# Patient Record
Sex: Male | Born: 1960 | Hispanic: Yes | Marital: Married
Health system: Southern US, Community
[De-identification: ages and names within clinical notes are randomized; demographics above are authoritative.]

## PROBLEM LIST (undated history)

## (undated) DIAGNOSIS — I10 Essential (primary) hypertension: Secondary | ICD-10-CM

## (undated) DIAGNOSIS — E7800 Pure hypercholesterolemia, unspecified: Secondary | ICD-10-CM

## (undated) HISTORY — PX: RECTOPERITONEAL FISTULA CLOSURE: SHX2314

---

## 2000-07-13 ENCOUNTER — Observation Stay (HOSPITAL_COMMUNITY): Admission: EM | Admit: 2000-07-13 | Discharge: 2000-07-13 | Payer: Self-pay | Admitting: Emergency Medicine

## 2000-07-13 ENCOUNTER — Encounter: Payer: Self-pay | Admitting: Emergency Medicine

## 2003-05-10 ENCOUNTER — Encounter: Payer: Self-pay | Admitting: Family Medicine

## 2003-05-10 ENCOUNTER — Encounter: Admission: RE | Admit: 2003-05-10 | Discharge: 2003-05-10 | Payer: Self-pay | Admitting: Family Medicine

## 2006-01-01 ENCOUNTER — Encounter: Admission: RE | Admit: 2006-01-01 | Discharge: 2006-01-01 | Payer: Self-pay | Admitting: Family Medicine

## 2008-05-08 ENCOUNTER — Emergency Department (HOSPITAL_COMMUNITY): Admission: EM | Admit: 2008-05-08 | Discharge: 2008-05-08 | Payer: Self-pay | Admitting: Emergency Medicine

## 2009-01-22 ENCOUNTER — Encounter: Admission: RE | Admit: 2009-01-22 | Discharge: 2009-01-22 | Payer: Self-pay | Admitting: Family Medicine

## 2010-07-22 IMAGING — US US RENAL
1 series · 14 of 25 positions shown · non-contrast
Comparison: None

CLINICAL DATA: Chronic renal disease.  Left flank pain for 3 weeks.

RENAL/URINARY TRACT ULTRASOUND COMPLETE

[Series 1: us renal · 0.29mm/px · 14 of 29 slices shown]
[im 1/29]
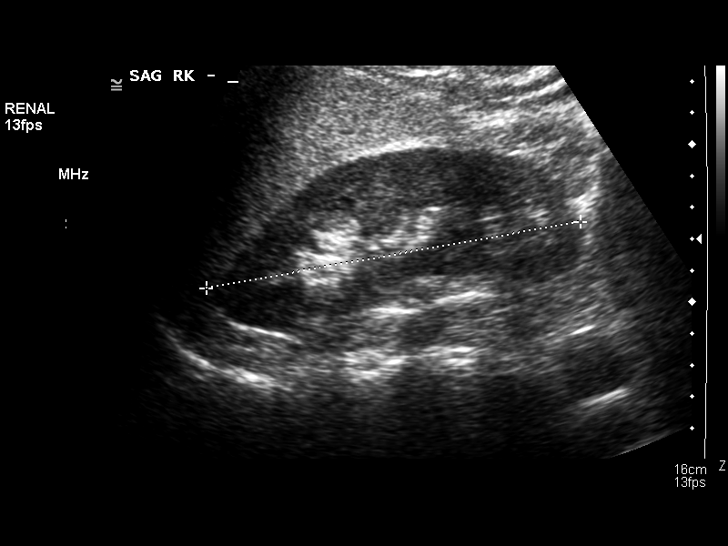
[im 3/29]
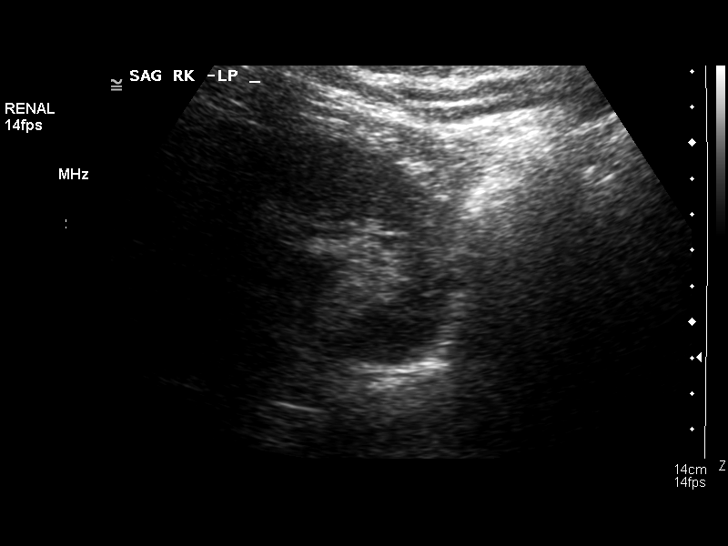
[im 5/29]
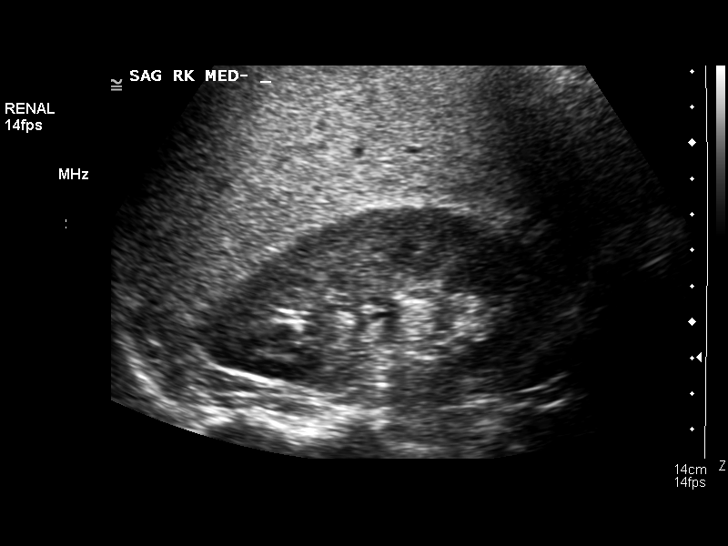
[im 8/29]
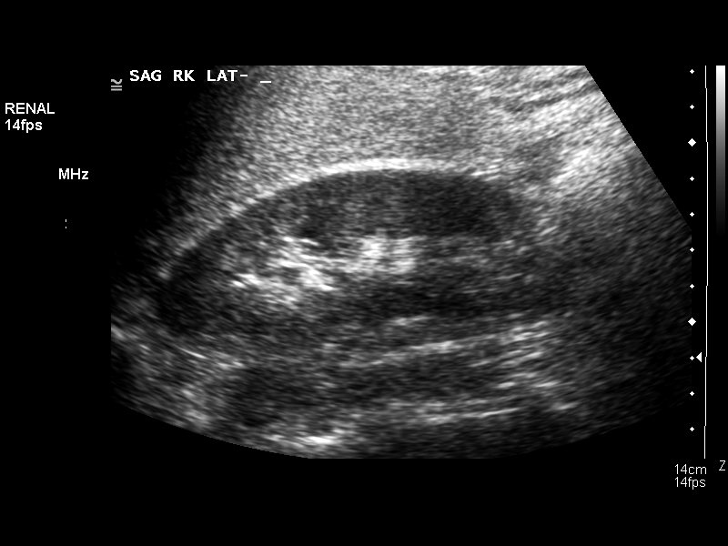
[im 10/29]
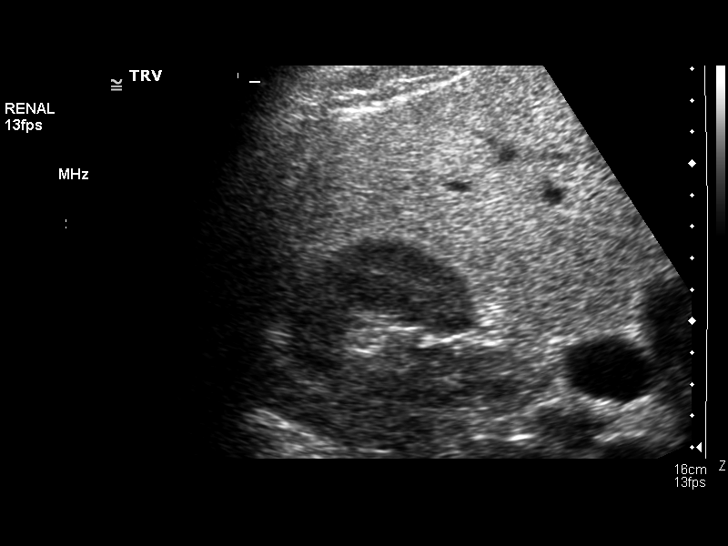
[im 11/29]
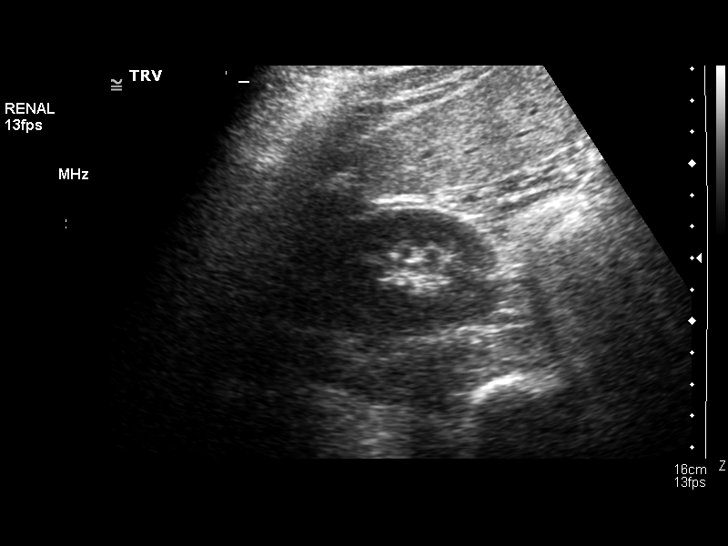
[im 13/29]
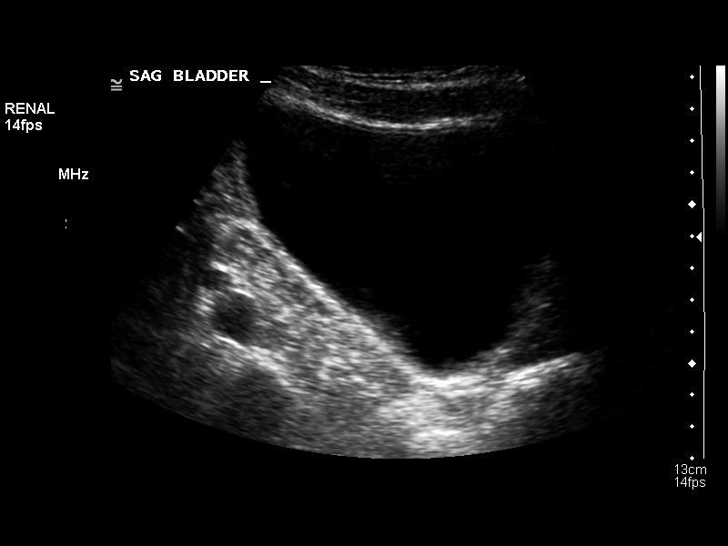
[im 16/29]
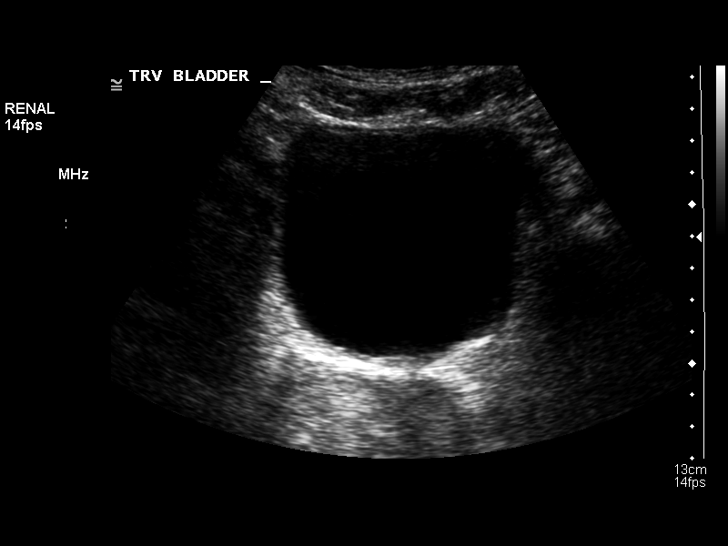
[im 18/29]
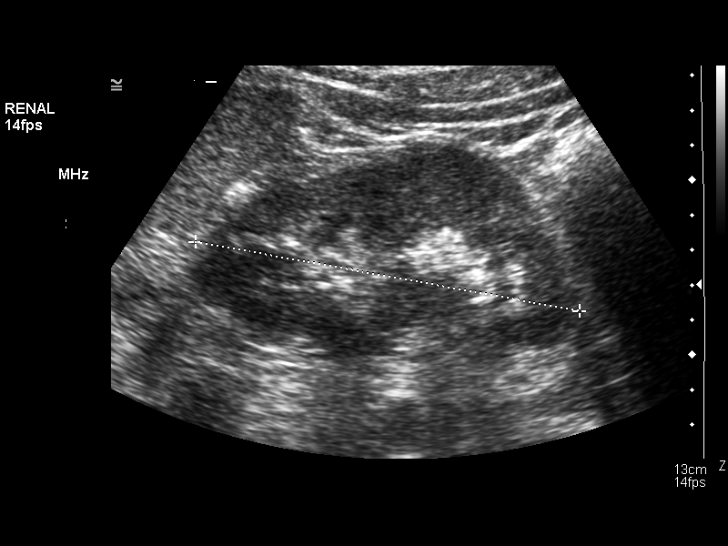
[im 19/29]
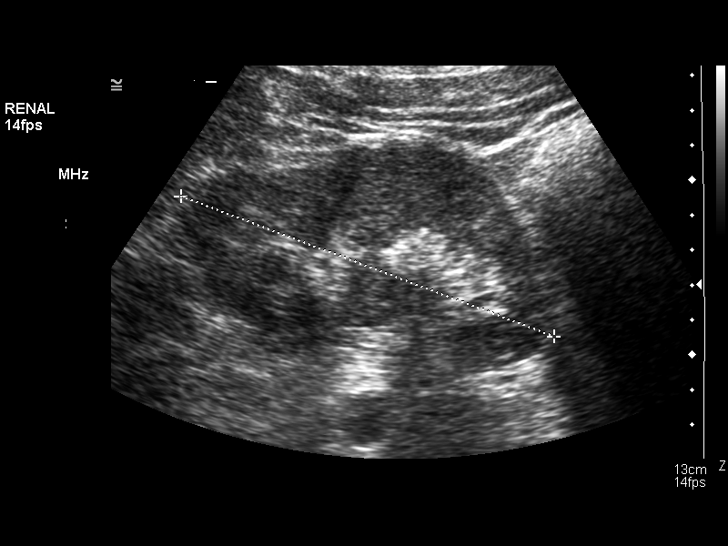
[im 22/29]
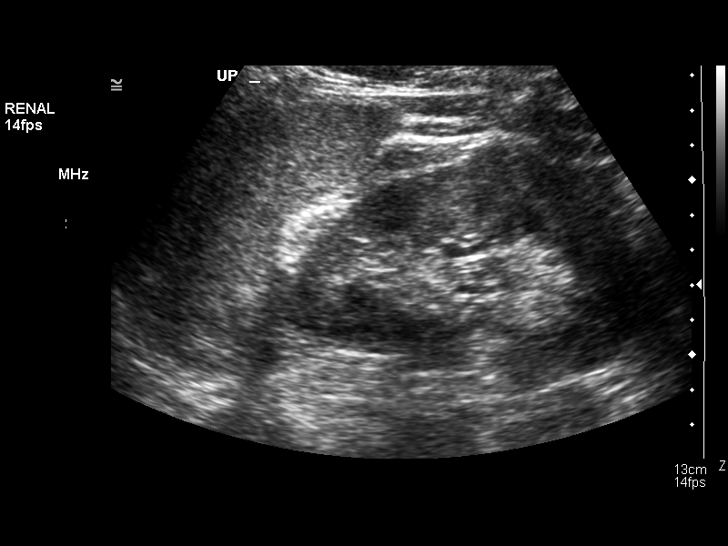
[im 24/29]
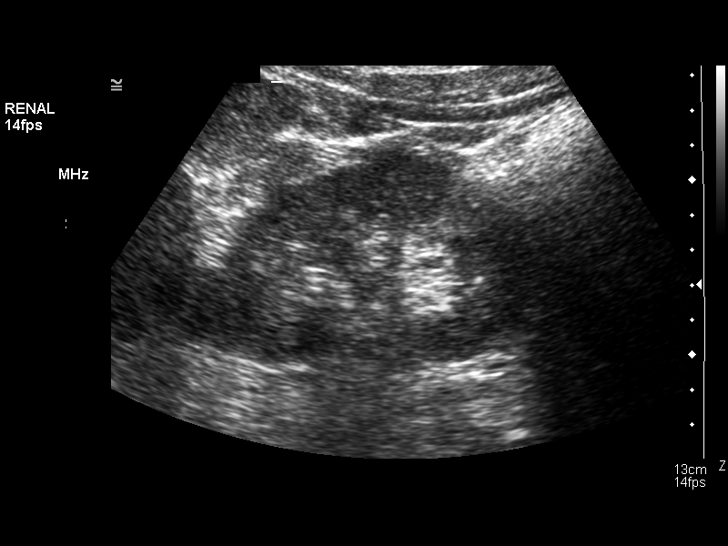
[im 26/29]
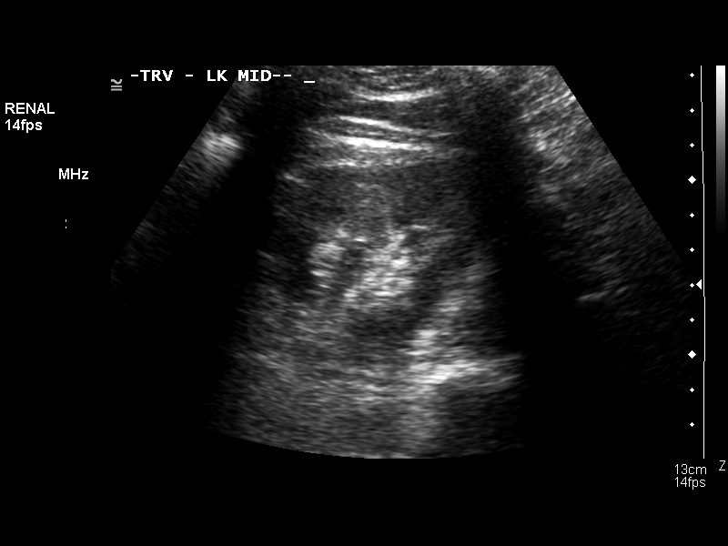
[im 29/29]
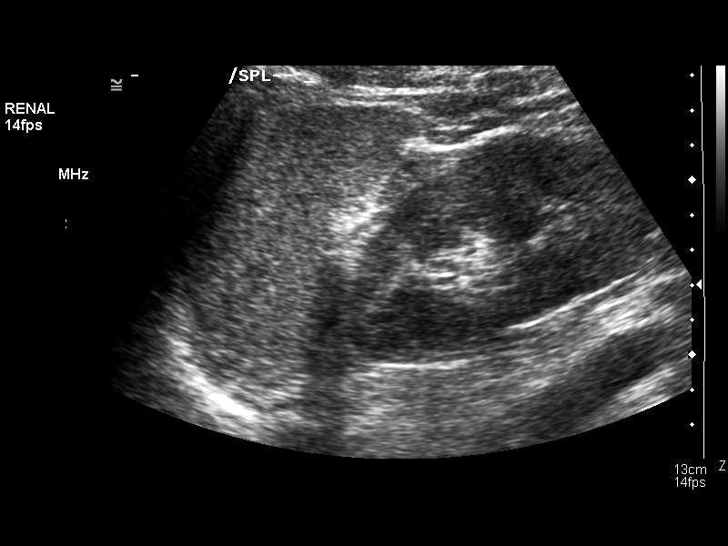

[14 of 25 positions shown; findings below may reference images not displayed]

FINDINGS: Right Kidney:  12.3 cm, negative.

Left Kidney:  11.4 cm, negative.

Bladder:  Normal.
IMPRESSION: No acute findings.

## 2010-12-13 NOTE — H&P (Signed)
[place]  Patient:    Brandon Walker, Brandon Walker                   MRN: [id number] Adm. Date:  [id number] Disc. Date: [id number] Attending:  [place], Peter Manning                         History and Physical  CHIEF COMPLAINT: Mr. Wiley is a 50 year old Hispanic male who presented to the emergency room today with a complaint of chest pain.  HISTORY OF PRESENT ILLNESS: The patient developed mid sternal chest pain last night after eating dinner.  He thought it was heartburn and took four Tums, and eventually his pain abated after three to four hours, but it did seem to improve with the Tums.  This morning he had no pain on awakening but on his way to work developed recurrent chest pain, this time radiating down both arms.  There was no nausea, vomiting, diaphoresis, or shortness of breath.  He presented to the emergency room and was given three sublingual nitroglycerin, which seemed to help but he still had persistent vague tightness in his chest. The patient reports he had an episode of chest pain 1-1/2 years ago and was seen at Urgent Care.  His evaluation at that time was negative but it was recommended that he have a follow-up stress test.  The patient did not follow up at that time.  PAST MEDICAL HISTORY:  1. Asthma.  2. Hypercholesterolemia.  3. History of nevous stomach.  PAST SURGICAL HISTORY: He has had no prior surgeries.  MEDICATIONS: He is on no medications.  ALLERGIES: No known drug allergies.  SOCIAL HISTORY: The patient is married.  He works at [employer].  He denies smoking or alcohol use.  FAMILY HISTORY: Unremarkable.  One brother has diabetes.  He does not know his fathers history.  His mother is alive and well.  PHYSICAL EXAMINATION:  GENERAL: The patient is a young Hispanic male in no apparent distress.  VITAL SIGNS: Blood pressure 120/65, pulse 70 and regular.  He is afebrile.  HEENT: PERRL.  Conjunctivae clear.   Oropharynx clear.  NECK: No JVD, adenopathy, or bruits.  LUNGS: Clear to auscultation and percussion.  CARDIAC: Regular rate and rhythm without murmurs, rubs, gallops, or clicks.  ABDOMEN: Soft, nontender.  Bowel sounds positive.  No hepatosplenomegaly, masses, or bruits.  EXTREMITIES: Without edema.  Femoral and pedal pulses were 2+ and symmetric.  NEUROLOGIC: Grossly intact.  LABORATORY DATA: CK is 122 with negative MB.  Creatinine 1.1.  Sodium 140, potassium 4.0, chloride 105, BUN 16, glucose 153.  ABG showed a pH of 7.46, pCO2 33, bicarbonate 23.  Hemoglobin 13.0, hematocrit 35.7, WBC 7200; platelets 233,000.  Troponin was 0.01.  ECG x 2 demonstrated normal sinus rhythm with mild diffuse ST elevation and a pattern of early repolarization.  IMPRESSION:  1. Atypical chest pain, rule out myocardial infarction.  2. History of asthma.  3. Hypercholesterolemia.  PLAN: The patient will be treated with p.o. Protonix and we will check a second set of cardiac enzymes in six hours.  If these are negative we will discharge the patient home on oral Protonix and arrange a follow-up exercise stress test in our office as an outpatient. DD:  07/13/00 TD:  07/14/00 Job: [id number]/[id number]

## 2011-04-28 LAB — DIFFERENTIAL
Basophils Relative: 0
Eosinophils Absolute: 0.1
Lymphs Abs: 1.4
Monocytes Relative: 7
Neutro Abs: 4.7
Neutrophils Relative %: 71

## 2011-04-28 LAB — BASIC METABOLIC PANEL
BUN: 11
CO2: 27
Calcium: 9.7
Chloride: 99
Creatinine, Ser: 0.74
GFR calc Af Amer: >60
GFR calc non Af Amer: >60
Glucose, Bld: 221 — ABNORMAL HIGH
Potassium: 4.5
Sodium: 134 — ABNORMAL LOW

## 2011-04-28 LAB — CBC
MCHC: 34.2
MCV: 86.6
Platelets: 208
RBC: 5.38
WBC: 6.7

## 2011-07-31 ENCOUNTER — Emergency Department (HOSPITAL_COMMUNITY): Payer: BC Managed Care – PPO

## 2011-07-31 ENCOUNTER — Emergency Department (HOSPITAL_COMMUNITY)
Admission: EM | Admit: 2011-07-31 | Discharge: 2011-07-31 | Disposition: A | Discharge status: Home / Self Care | Payer: BC Managed Care – PPO | Attending: Emergency Medicine | Admitting: Emergency Medicine

## 2011-07-31 ENCOUNTER — Other Ambulatory Visit: Payer: Self-pay

## 2011-07-31 ENCOUNTER — Encounter: Payer: Self-pay | Admitting: *Deleted

## 2011-07-31 DIAGNOSIS — R209 Unspecified disturbances of skin sensation: Secondary | ICD-10-CM | POA: Insufficient documentation

## 2011-07-31 DIAGNOSIS — G5600 Carpal tunnel syndrome, unspecified upper limb: Secondary | ICD-10-CM | POA: Insufficient documentation

## 2011-07-31 DIAGNOSIS — Z794 Long term (current) use of insulin: Secondary | ICD-10-CM | POA: Insufficient documentation

## 2011-07-31 DIAGNOSIS — E119 Type 2 diabetes mellitus without complications: Secondary | ICD-10-CM | POA: Insufficient documentation

## 2011-07-31 DIAGNOSIS — I10 Essential (primary) hypertension: Secondary | ICD-10-CM | POA: Insufficient documentation

## 2011-07-31 DIAGNOSIS — R51 Headache: Secondary | ICD-10-CM | POA: Insufficient documentation

## 2011-07-31 DIAGNOSIS — R002 Palpitations: Secondary | ICD-10-CM | POA: Insufficient documentation

## 2011-07-31 HISTORY — DX: Essential (primary) hypertension: I10

## 2011-07-31 HISTORY — DX: Pure hypercholesterolemia, unspecified: E78.00

## 2011-07-31 LAB — GLUCOSE, CAPILLARY: Glucose-Capillary: 257 mg/dL — ABNORMAL HIGH (ref 70–99)

## 2011-07-31 LAB — BASIC METABOLIC PANEL
BUN: 16 mg/dL (ref 6–23)
Creatinine, Ser: 0.81 mg/dL (ref 0.50–1.35)
GFR calc Af Amer: >90 mL/min (ref >90)
GFR calc non Af Amer: >90 mL/min (ref >90)

## 2011-07-31 LAB — CBC
HCT: 42.8 % (ref 39.0–52.0)
Hemoglobin: 15.2 g/dL (ref 13.0–17.0)
MCH: 29.6 pg (ref 26.0–34.0)
MCHC: 35.5 g/dL (ref 30.0–36.0)
MCV: 83.4 fL (ref 78.0–100.0)

## 2011-07-31 LAB — DIFFERENTIAL
Basophils Relative: 0 % (ref 0–1)
Eosinophils Absolute: 0.1 10*3/uL (ref 0.0–0.7)
Eosinophils Relative: 2 % (ref 0–5)
Monocytes Absolute: 0.5 10*3/uL (ref 0.1–1.0)
Monocytes Relative: 8 % (ref 3–12)
Neutrophils Relative %: 57 % (ref 43–77)

## 2011-07-31 MED ORDER — SODIUM CHLORIDE 0.9 % IV BOLUS (SEPSIS)
1000.0000 mL | Freq: Once | INTRAVENOUS | Status: AC
  Administered 2011-07-31: 1000 mL via INTRAVENOUS

## 2011-07-31 MED ORDER — TRAMADOL HCL 50 MG PO TABS: 50.0000 mg | ORAL_TABLET | Freq: Four times a day (QID) | ORAL | Status: AC | PRN

## 2011-07-31 MED ORDER — MORPHINE SULFATE 4 MG/ML IJ SOLN
4.0000 mg | Freq: Once | INTRAMUSCULAR | Status: AC
  Administered 2011-07-31: 4 mg via INTRAVENOUS
  Filled 2011-07-31: qty 1

## 2011-07-31 NOTE — ED Provider Notes (Signed)
History     CSN: [id number]  Arrival date & time 07/31/11  1358   First MD Initiated Contact with Patient 07/31/11 1630      Chief Complaint  Patient presents with  . Numbness    right arm and hand  HPI Patient presents to the emergency room with complaint of right hand numbness that occurred today after lunch. The patient works at [employer] and lifts a lot of lumbar. Patient reports some mild heart palpitations, but no chest pain or SOB. Patient denies any headaches, vertigo, weakness or other complaints. Denies any chest pain with exertion. Denies any exertional dyspnea. Denies any other complaints.   Past Medical History  Diagnosis Date  . Diabetes mellitus   . Hypertension   . Hypercholesteremia     Past Surgical History  Procedure Date  . Rectoperitoneal fistula closure     No family history on file.  History  Substance Use Topics  . Smoking status: Passive Smoker    Types: Cigars  . Smokeless tobacco: Not on file  . Alcohol Use: No      Review of Systems  Constitutional: Negative for fever, chills, diaphoresis and appetite change.  HENT: Negative for neck pain.   Eyes: Negative for photophobia and visual disturbance.  Respiratory: Negative for cough, choking, chest tightness, shortness of breath, wheezing and stridor.   Cardiovascular: Positive for palpitations. Negative for chest pain and leg swelling.  Gastrointestinal: Negative for nausea, vomiting and abdominal pain.  Genitourinary: Negative for flank pain.  Musculoskeletal: Negative for back pain.  Skin: Negative for rash.  Neurological: Positive for numbness. Negative for weakness.  All other systems reviewed and are negative.    Allergies  Review of patient's allergies indicates no known allergies.  Home Medications   Current Outpatient Rx  Name Route Sig Dispense Refill  . ASPIRIN 81 MG PO TABS Oral Take 160 mg by mouth daily.      Marland Kitchen EZETIMIBE 10 MG PO TABS Oral Take 10 mg by mouth daily.       . IBUPROFEN 200 MG PO TABS Oral Take 400 mg by mouth every 6 (six) hours as needed.      . INSULIN ASPART PROT & ASPART (70-30) 100 UNIT/ML [place] SUSP Subcutaneous Inject into the skin. 35 Units QAM & 25 QHS     . LISINOPRIL 20 MG PO TABS Oral Take 20 mg by mouth daily.      Marland Kitchen PRAVASTATIN SODIUM 10 MG PO TABS Oral Take 10 mg by mouth daily.        BP 114/69  Pulse 67  Temp(Src) 97.7 F (36.5 C) (Oral)  Resp 18  Wt 185 lb (83.915 kg)  SpO2 98%  Physical Exam  Nursing note and vitals reviewed. Constitutional: He is oriented to person, place, and time. He appears well-developed and well-nourished. No distress.  HENT:  Head: Normocephalic and atraumatic.  Mouth/Throat: Oropharynx is clear and moist.  Eyes: EOM are normal. Pupils are equal, round, and reactive to light.  Neck: Normal range of motion. Neck supple.  Cardiovascular: Normal rate, regular rhythm, normal heart sounds and intact distal pulses.  Exam reveals no gallop and no friction rub.   No murmur heard. Pulmonary/Chest: Effort normal and breath sounds normal. No respiratory distress. He has no wheezes. He exhibits no tenderness.  Abdominal: Soft. Bowel sounds are normal. There is no tenderness. There is no rebound and no guarding.  Musculoskeletal: Normal range of motion. He exhibits no edema and no tenderness.  Positive tinel's sign to both wrist. Radial pulse intact. Full sensation. Strength 5/5 for upper and lower extremities.   Neurological: He is alert and oriented to person, place, and time. He displays normal reflexes. No cranial nerve deficit or sensory deficit. He exhibits normal muscle tone. He displays a negative Romberg sign. Coordination and gait normal. GCS eye subscore is 4. GCS verbal subscore is 5. GCS motor subscore is 6. He displays no Babinski's sign on the right side. He displays no Babinski's sign on the left side.       Normal finger to nose testing. Normal grip. No pronator drift. No nystagmus on  examination. Normal fast alternating movements.   Skin: Skin is warm and dry. No rash noted. He is not diaphoretic. No erythema. No pallor.  Psychiatric: He has a normal mood and affect. His behavior is normal. Judgment and thought content normal.    ED Course  Procedures (including critical care time)  Patient seen and evaluated.  VSS reviewed. . Nursing notes reviewed. Discussed with attending physician, Dr. Effie Shy. Initial testing ordered. Will monitor the patient closely. They agree with the treatment plan and diagnosis.   Results for orders placed during the hospital encounter of 07/31/11  GLUCOSE, CAPILLARY      Component Value Range   Glucose-Capillary 257 (*) 70 - 99 (mg/dL)  CBC      Component Value Range   WBC 7.1  4.0 - 10.5 (K/uL)   RBC 5.13  4.22 - 5.81 (MIL/uL)   Hemoglobin 15.2  13.0 - 17.0 (g/dL)   HCT 16.1  09.6 - 04.5 (%)   MCV 83.4  78.0 - 100.0 (fL)   MCH 29.6  26.0 - 34.0 (pg)   MCHC 35.5  30.0 - 36.0 (g/dL)   RDW 40.9  81.1 - 91.4 (%)   Platelets 206  150 - 400 (K/uL)  DIFFERENTIAL      Component Value Range   Neutrophils Relative 57  43 - 77 (%)   Neutro Abs 4.0  1.7 - 7.7 (K/uL)   Lymphocytes Relative 34  12 - 46 (%)   Lymphs Abs 2.4  0.7 - 4.0 (K/uL)   Monocytes Relative 8  3 - 12 (%)   Monocytes Absolute 0.5  0.1 - 1.0 (K/uL)   Eosinophils Relative 2  0 - 5 (%)   Eosinophils Absolute 0.1  0.0 - 0.7 (K/uL)   Basophils Relative 0  0 - 1 (%)   Basophils Absolute 0.0  0.0 - 0.1 (K/uL)  BASIC METABOLIC PANEL      Component Value Range   Sodium 136  135 - 145 (mEq/L)   Potassium 4.2  3.5 - 5.1 (mEq/L)   Chloride 100  96 - 112 (mEq/L)   CO2 26  19 - 32 (mEq/L)   Glucose, Bld 240 (*) 70 - 99 (mg/dL)   BUN 16  6 - 23 (mg/dL)   Creatinine, Ser 7.82  0.50 - 1.35 (mg/dL)   Calcium 9.8  8.4 - 95.6 (mg/dL)   GFR calc non Af Amer >90  >90 (mL/min)   GFR calc Af Amer >90  >90 (mL/min)  TROPONIN I      Component Value Range   Troponin I <0.30  <0.30 (ng/mL)     Dg Chest 2 View  07/31/2011  *RADIOLOGY REPORT*  Clinical Data: Headache, hypertension, diabetes  CHEST - 2 VIEW  Comparison: Chest x-ray of 05/08/2008  Findings: The lungs are clear.  Mediastinal contours appear normal. The heart is  within normal limits in size.  No bony abnormality is seen.  IMPRESSION: No active lung disease.  Original Report Authenticated By: Juline Patch, M.D.    Patient seen and re-evaluated. Resting comfortably. VSS stable. NAD. Patient notified of testing results. Stated agreement and understanding. Patient stated understanding to treatment plan and diagnosis. Findings consistent with carpal tunnel syndrome, no weakness/numbness. No neurofocal deficits. Patient states that he feels better. States that he will follow up with his primary car ephysician for further evaluation, Dr. Laurine Blazer.    Date: 07/31/2011  Rate: 76  Rhythm: normal sinus rhythm  QRS Axis: normal  Intervals: normal  ST/T Wave abnormalities: normal  Conduction Disutrbances:none  Narrative Interpretation:   Old EKG Reviewed: unchanged   MDM  Numbness, carpal tunnel syndrome. Only located at the hands. Positive tinel's. Patient has no neurofocal deficits. No chest pain. Workup negative. Will refer to orthopedics.         Demetrius Charity, PA 07/31/11 1920

## 2011-07-31 NOTE — ED Notes (Signed)
Pt states "about 2 days ago I felt fluttering in my chest, like my heart, today about 1300 I developed right arm and right leg got numb, when I was walking my hands and right leg got really clammy"

## 2011-07-31 NOTE — ED Provider Notes (Signed)
2:42 PM  Date: 07/31/2011  Rate: 76  Rhythm: normal sinus rhythm  QRS Axis: normal  Intervals: normal  ST/T Wave abnormalities: normal  Conduction Disutrbances:none  Narrative Interpretation: Normal EKG  Old EKG Reviewed: unchanged    Carleene Cooper III, MD 07/31/11 1443

## 2011-07-31 NOTE — ED Notes (Signed)
Rx given x1 D/c instructions reviewed w/ pt and family - pt and family deny any further questions or concerns at present.  

## 2011-08-01 NOTE — ED Provider Notes (Signed)
Medical screening examination/treatment/procedure(s) were performed by non-physician practitioner and as supervising physician I was immediately available for consultation/collaboration.  Flint Melter, MD 08/01/11 Jacinta Shoe

## 2013-01-27 IMAGING — CR DG CHEST 2V
2 series · 2 of 2 positions shown · non-contrast
Comparison: Chest x-ray of 05/08/2008

CLINICAL DATA: Headache, hypertension, diabetes

CHEST - 2 VIEW

[w chest pa]
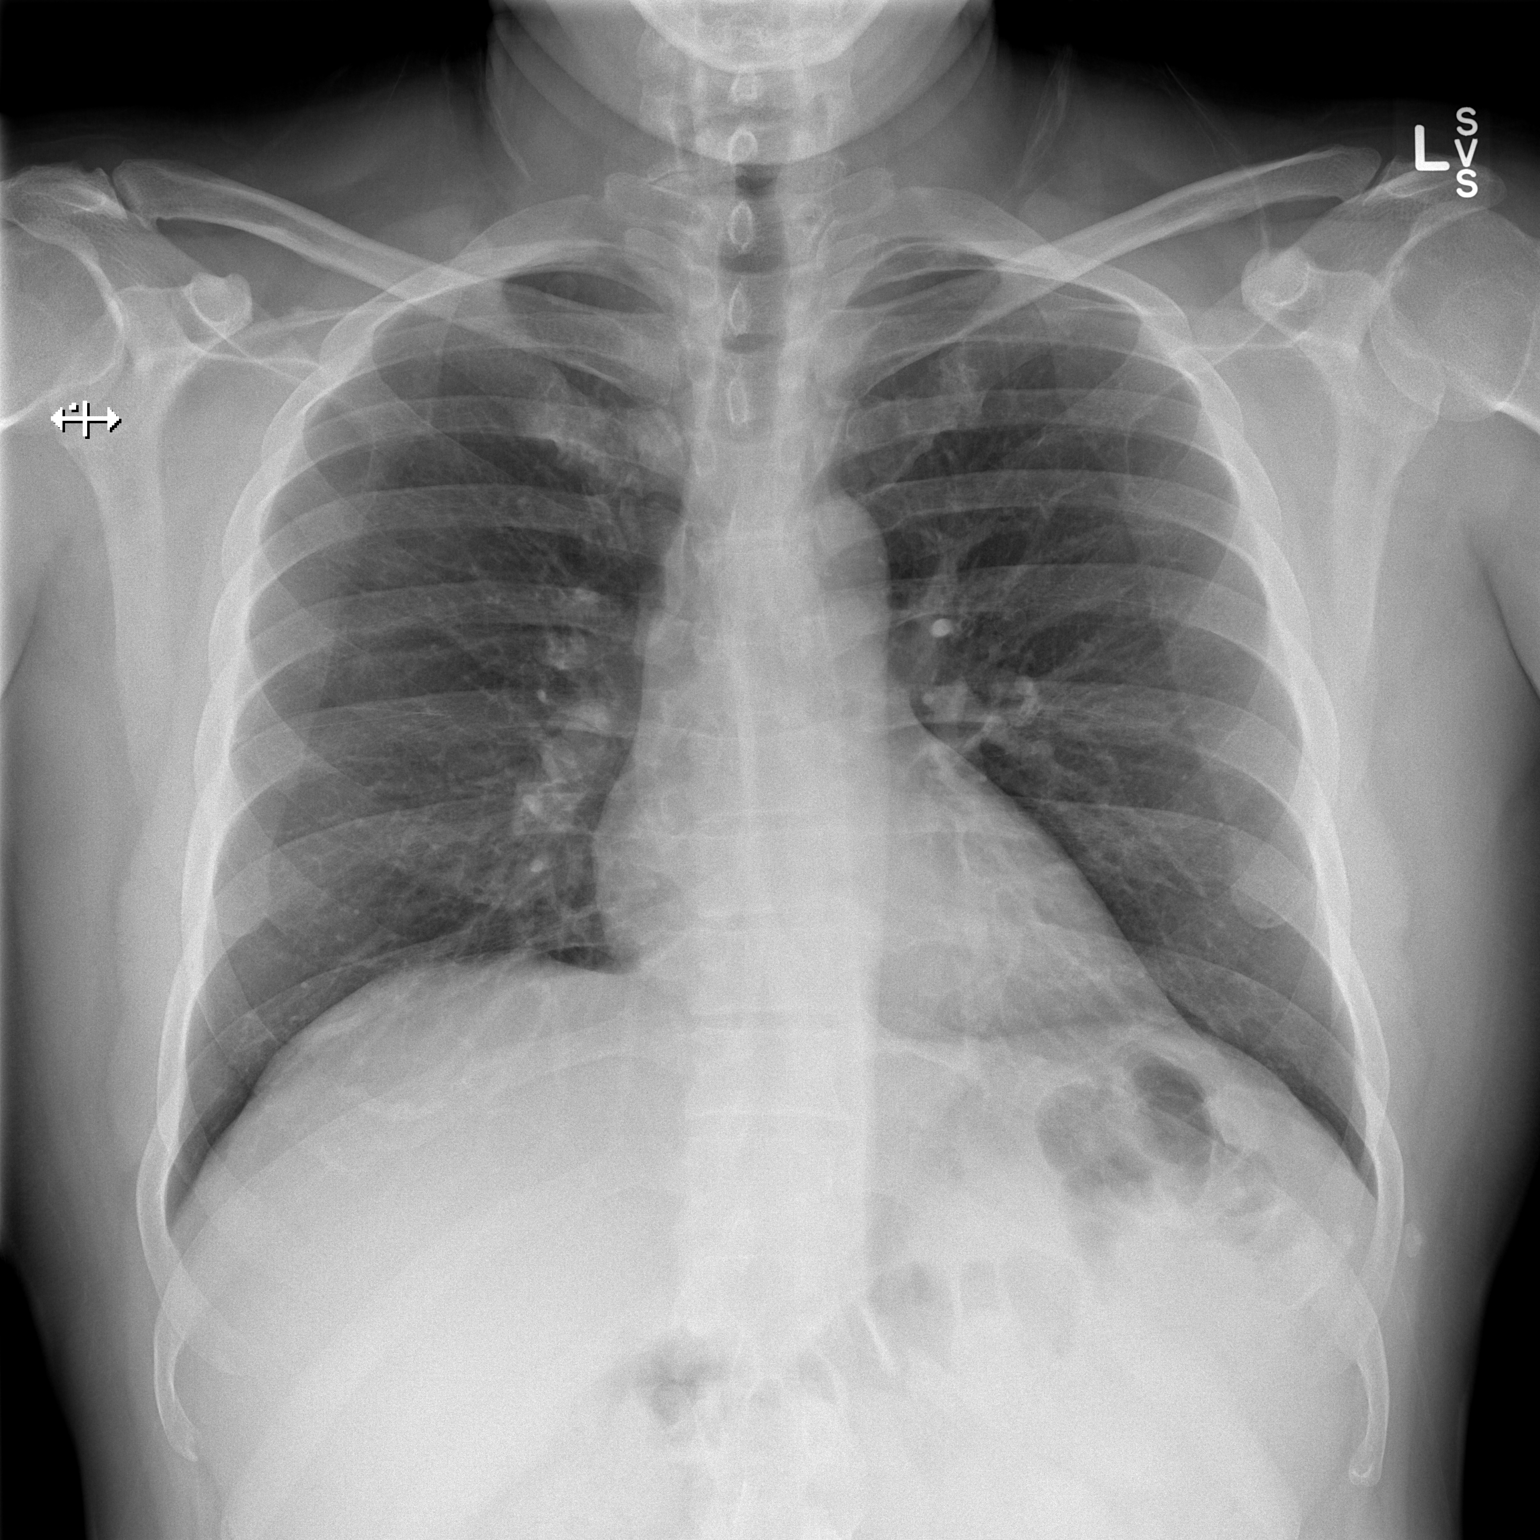

[w chest lat]
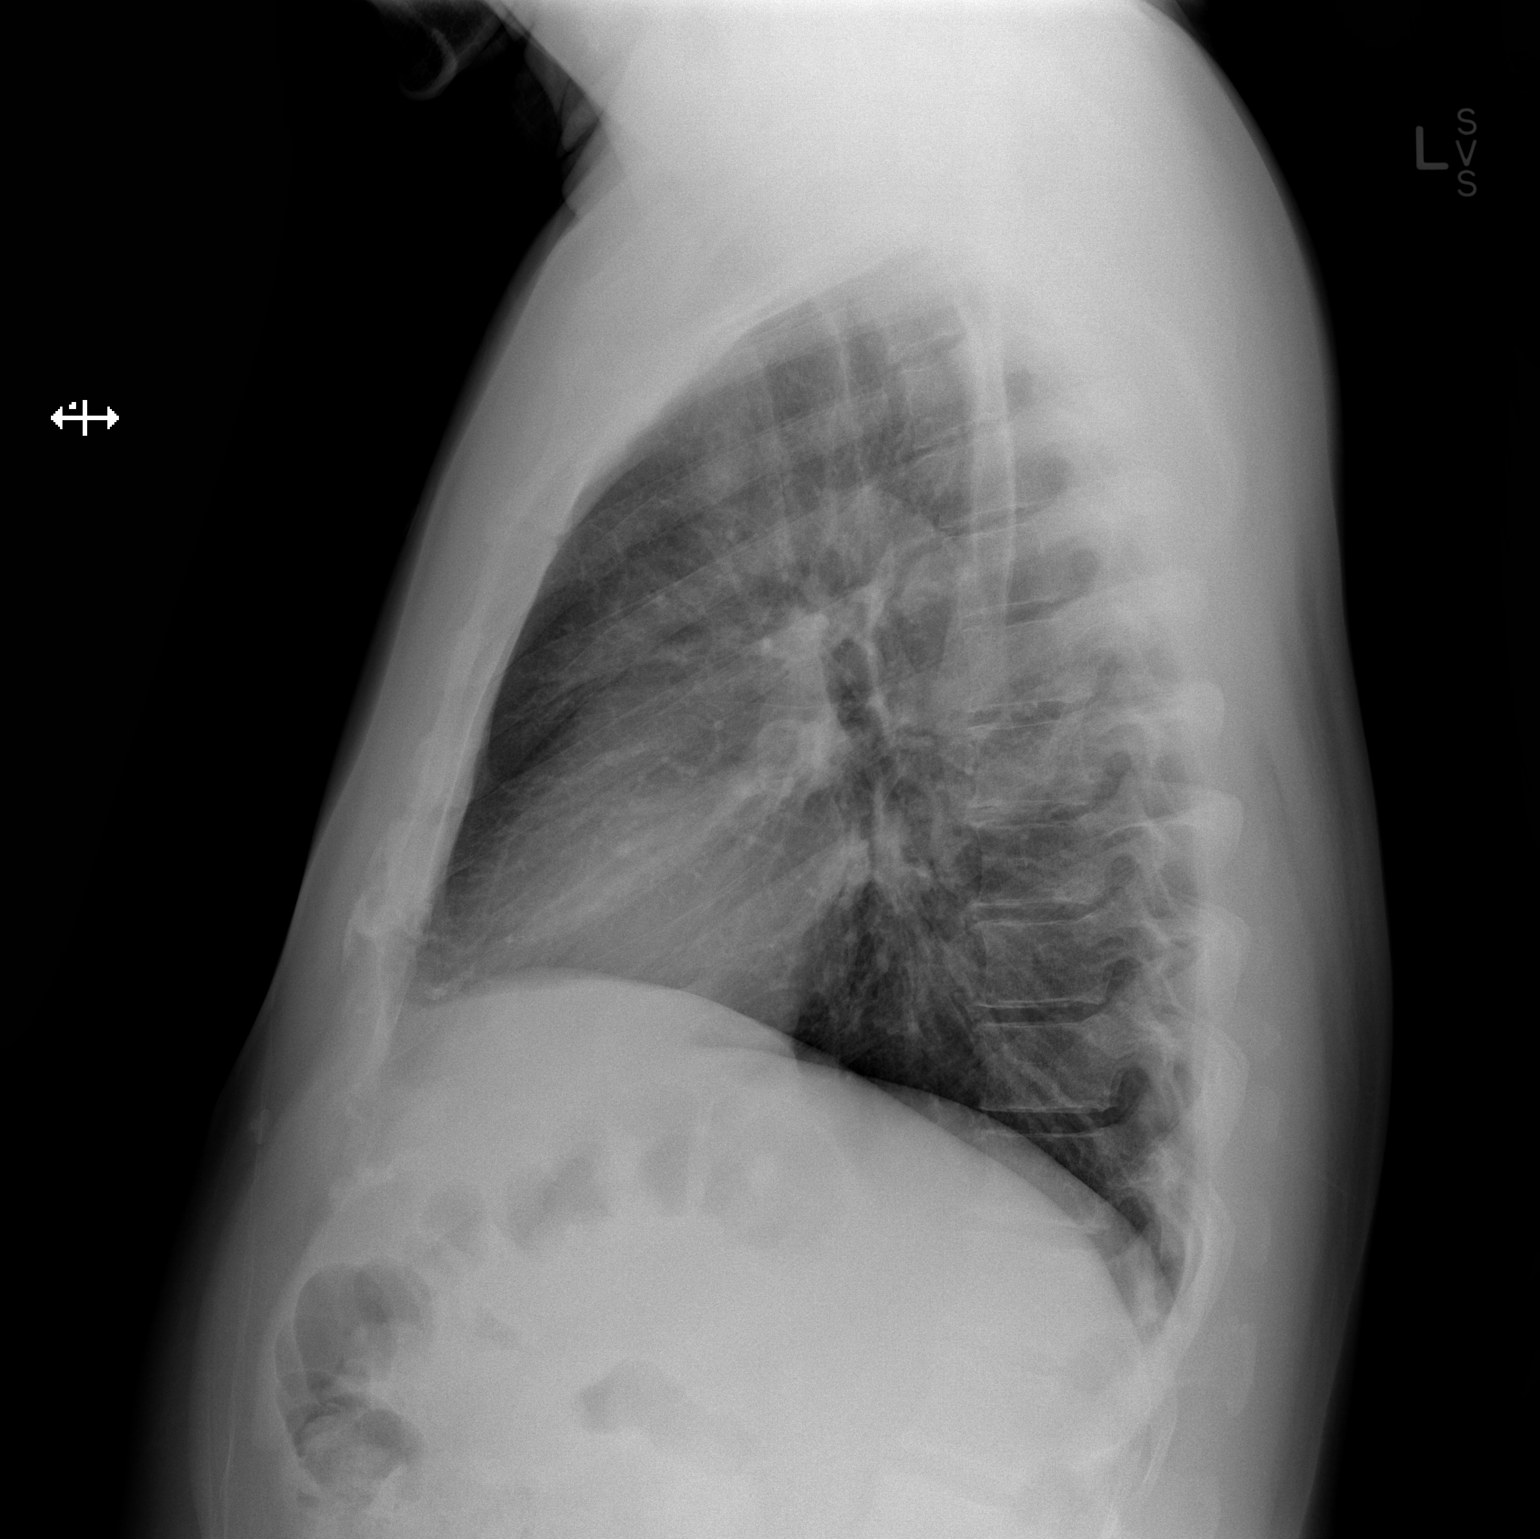

[2 of 2 positions shown; findings below may reference images not displayed]

FINDINGS: The lungs are clear.  Mediastinal contours appear normal.
The heart is within normal limits in size.  No bony abnormality is
seen.
IMPRESSION: No active lung disease.

## 2014-02-24 ENCOUNTER — Observation Stay (HOSPITAL_COMMUNITY)
Admission: EM | Admit: 2014-02-24 | Discharge: 2014-02-25 | Disposition: A | Discharge status: Home / Self Care | Payer: Managed Care, Other (non HMO) | Attending: Internal Medicine | Admitting: Internal Medicine

## 2014-02-24 ENCOUNTER — Emergency Department (HOSPITAL_COMMUNITY): Payer: Managed Care, Other (non HMO)

## 2014-02-24 ENCOUNTER — Encounter (HOSPITAL_COMMUNITY): Payer: Self-pay | Admitting: Emergency Medicine

## 2014-02-24 DIAGNOSIS — K2100 Gastro-esophageal reflux disease with esophagitis, without bleeding: Secondary | ICD-10-CM

## 2014-02-24 DIAGNOSIS — E7800 Pure hypercholesterolemia, unspecified: Secondary | ICD-10-CM | POA: Insufficient documentation

## 2014-02-24 DIAGNOSIS — Z23 Encounter for immunization: Secondary | ICD-10-CM | POA: Insufficient documentation

## 2014-02-24 DIAGNOSIS — E099 Drug or chemical induced diabetes mellitus without complications: Secondary | ICD-10-CM

## 2014-02-24 DIAGNOSIS — Z794 Long term (current) use of insulin: Secondary | ICD-10-CM | POA: Insufficient documentation

## 2014-02-24 DIAGNOSIS — I10 Essential (primary) hypertension: Secondary | ICD-10-CM

## 2014-02-24 DIAGNOSIS — Z79899 Other long term (current) drug therapy: Secondary | ICD-10-CM | POA: Insufficient documentation

## 2014-02-24 DIAGNOSIS — Z7982 Long term (current) use of aspirin: Secondary | ICD-10-CM | POA: Insufficient documentation

## 2014-02-24 DIAGNOSIS — E119 Type 2 diabetes mellitus without complications: Secondary | ICD-10-CM

## 2014-02-24 DIAGNOSIS — K219 Gastro-esophageal reflux disease without esophagitis: Secondary | ICD-10-CM | POA: Diagnosis present

## 2014-02-24 DIAGNOSIS — E785 Hyperlipidemia, unspecified: Secondary | ICD-10-CM | POA: Diagnosis present

## 2014-02-24 DIAGNOSIS — R079 Chest pain, unspecified: Principal | ICD-10-CM

## 2014-02-24 LAB — BASIC METABOLIC PANEL
Anion gap: 14 (ref 5–15)
BUN: 18 mg/dL (ref 6–23)
CHLORIDE: 102 meq/L (ref 96–112)
CO2: 23 meq/L (ref 19–32)
Calcium: 9.8 mg/dL (ref 8.4–10.5)
Creatinine, Ser: 0.87 mg/dL (ref 0.50–1.35)
GFR calc non Af Amer: >90 mL/min (ref >90)
Glucose, Bld: 137 mg/dL — ABNORMAL HIGH (ref 70–99)
POTASSIUM: 4.3 meq/L (ref 3.7–5.3)
Sodium: 139 mEq/L (ref 137–147)

## 2014-02-24 LAB — I-STAT TROPONIN, ED
Troponin i, poc: 0.01 ng/mL (ref 0.00–0.08)
Troponin i, poc: 0 ng/mL (ref 0.00–0.08)

## 2014-02-24 LAB — CBC
HEMATOCRIT: 39.7 % (ref 39.0–52.0)
HEMOGLOBIN: 14 g/dL (ref 13.0–17.0)
MCH: 29.7 pg (ref 26.0–34.0)
MCHC: 35.3 g/dL (ref 30.0–36.0)
MCV: 84.1 fL (ref 78.0–100.0)
Platelets: 257 10*3/uL (ref 150–400)
RBC: 4.72 MIL/uL (ref 4.22–5.81)
RDW: 12.1 % (ref 11.5–15.5)
WBC: 7.2 10*3/uL (ref 4.0–10.5)

## 2014-02-24 LAB — PROTIME-INR
INR: 0.93 (ref 0.00–1.49)
PROTHROMBIN TIME: 12.5 s (ref 11.6–15.2)

## 2014-02-24 LAB — APTT: APTT: 24 s (ref 24–37)

## 2014-02-24 LAB — D-DIMER, QUANTITATIVE (NOT AT ARMC)

## 2014-02-24 LAB — GLUCOSE, CAPILLARY: Glucose-Capillary: 103 mg/dL — ABNORMAL HIGH (ref 70–99)

## 2014-02-24 LAB — TROPONIN I: Troponin I: <0.3 ng/mL (ref <0.30)

## 2014-02-24 MED ORDER — ACETAMINOPHEN 650 MG RE SUPP: 650.0000 mg | Freq: Four times a day (QID) | RECTAL | Status: DC | PRN

## 2014-02-24 MED ORDER — SODIUM CHLORIDE 0.9 % IJ SOLN
3.0000 mL | Freq: Two times a day (BID) | INTRAMUSCULAR | Status: DC
  Administered 2014-02-24: 3 mL via INTRAVENOUS

## 2014-02-24 MED ORDER — ASPIRIN EC 81 MG PO TBEC
81.0000 mg | DELAYED_RELEASE_TABLET | Freq: Every day | ORAL | Status: DC
  Administered 2014-02-25: 81 mg via ORAL
  Filled 2014-02-24: qty 1

## 2014-02-24 MED ORDER — PANTOPRAZOLE SODIUM 40 MG PO TBEC: 40.0000 mg | DELAYED_RELEASE_TABLET | Freq: Every day | ORAL | Status: DC

## 2014-02-24 MED ORDER — NITROGLYCERIN 0.4 MG SL SUBL: 0.4000 mg | SUBLINGUAL_TABLET | SUBLINGUAL | Status: DC | PRN

## 2014-02-24 MED ORDER — ONDANSETRON HCL 4 MG PO TABS: 4.0000 mg | ORAL_TABLET | Freq: Four times a day (QID) | ORAL | Status: DC | PRN

## 2014-02-24 MED ORDER — NITROGLYCERIN 2 % TD OINT
1.0000 [in_us] | TOPICAL_OINTMENT | Freq: Once | TRANSDERMAL | Status: AC
  Administered 2014-02-24: 1 [in_us] via TOPICAL
  Filled 2014-02-24: qty 1

## 2014-02-24 MED ORDER — INSULIN GLARGINE 100 UNITS/ML SOLOSTAR PEN
40.0000 [IU] | PEN_INJECTOR | Freq: Every day | SUBCUTANEOUS | Status: DC
  Filled 2014-02-24: qty 3

## 2014-02-24 MED ORDER — ENOXAPARIN SODIUM 40 MG/0.4ML ~~LOC~~ SOLN
40.0000 mg | SUBCUTANEOUS | Status: DC
  Administered 2014-02-24: 40 mg via SUBCUTANEOUS
  Filled 2014-02-24 (×2): qty 0.4

## 2014-02-24 MED ORDER — INSULIN ASPART 100 UNIT/ML ~~LOC~~ SOLN
0.0000 [IU] | Freq: Three times a day (TID) | SUBCUTANEOUS | Status: DC
  Administered 2014-02-25: 2 [IU] via SUBCUTANEOUS

## 2014-02-24 MED ORDER — ACETAMINOPHEN 325 MG PO TABS
650.0000 mg | ORAL_TABLET | Freq: Four times a day (QID) | ORAL | Status: DC | PRN
  Administered 2014-02-25: 650 mg via ORAL
  Filled 2014-02-24: qty 2

## 2014-02-24 MED ORDER — INSULIN GLARGINE 100 UNIT/ML ~~LOC~~ SOLN
40.0000 [IU] | Freq: Every day | SUBCUTANEOUS | Status: DC
  Administered 2014-02-24: 40 [IU] via SUBCUTANEOUS
  Filled 2014-02-24 (×2): qty 0.4

## 2014-02-24 MED ORDER — PNEUMOCOCCAL VAC POLYVALENT 25 MCG/0.5ML IJ INJ
0.5000 mL | INJECTION | INTRAMUSCULAR | Status: AC
  Administered 2014-02-25: 0.5 mL via INTRAMUSCULAR
  Filled 2014-02-24: qty 0.5

## 2014-02-24 MED ORDER — OXYCODONE HCL 5 MG PO TABS: 5.0000 mg | ORAL_TABLET | ORAL | Status: DC | PRN

## 2014-02-24 MED ORDER — ONDANSETRON HCL 4 MG/2ML IJ SOLN: 4.0000 mg | Freq: Four times a day (QID) | INTRAMUSCULAR | Status: DC | PRN

## 2014-02-24 MED ORDER — FENOFIBRATE 160 MG PO TABS
160.0000 mg | ORAL_TABLET | Freq: Every day | ORAL | Status: DC
  Administered 2014-02-24 – 2014-02-25 (×2): 160 mg via ORAL
  Filled 2014-02-24 (×2): qty 1

## 2014-02-24 MED ORDER — ASPIRIN 81 MG PO CHEW
324.0000 mg | CHEWABLE_TABLET | Freq: Once | ORAL | Status: DC
  Filled 2014-02-24: qty 4

## 2014-02-24 MED ORDER — MORPHINE SULFATE 2 MG/ML IJ SOLN: 1.0000 mg | INTRAMUSCULAR | Status: DC | PRN

## 2014-02-24 MED ORDER — LISINOPRIL 20 MG PO TABS
20.0000 mg | ORAL_TABLET | Freq: Every day | ORAL | Status: DC
  Administered 2014-02-25: 20 mg via ORAL
  Filled 2014-02-24: qty 1

## 2014-02-24 MED ORDER — PANTOPRAZOLE SODIUM 40 MG PO TBEC
40.0000 mg | DELAYED_RELEASE_TABLET | Freq: Every day | ORAL | Status: DC
  Administered 2014-02-24: 40 mg via ORAL
  Filled 2014-02-24: qty 1

## 2014-02-24 MED ORDER — SODIUM CHLORIDE 0.9 % IV SOLN
1000.0000 mL | INTRAVENOUS | Status: DC
  Administered 2014-02-24: 1000 mL via INTRAVENOUS

## 2014-02-24 MED ORDER — KETOROLAC TROMETHAMINE 15 MG/ML IJ SOLN
15.0000 mg | Freq: Once | INTRAMUSCULAR | Status: AC
  Administered 2014-02-24: 15 mg via INTRAVENOUS
  Filled 2014-02-24: qty 1

## 2014-02-24 NOTE — H&P (Signed)
[facility] History and Physical  Brandon Walker YQM:[id number] DOB: [birth year] DOA: 02/24/2014  Referring physician: edp PCP: Brandon Reeve, MD   Chief Complaint: CHEST pain last night   HPI: Brandon Walker is a 53 y.o. male with prior h/o hypertension, Diabetes mellitus, hyperlipidemia, comes in for right sided chest pain occurred last night, radiating to the right. He thought it was gas and let go. Earlier this morning patient reported left sided chest pain and substernal chest pain radiating to the left arm and between the shoulder blades. Currently he is chest pain free. He reports some trouble catching his breathing when having the chest pain. No palpitations, orthopnea, pnd, pedal edema. Has a brother who underwent cabg recently. He also reports tingling numbness of his left arm and left foot more when compared to his right arm and right foot. 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 He denies any other complaints. He is referred to medical service for admission.    Review of Systems:  Constitutional:  No weight loss, night sweats, Fevers, chills, fatigue.  HEENT:  No headaches, Difficulty swallowing,Tooth/dental problems,Sore throat,  No sneezing, itching, ear ache, nasal congestion, post nasal drip,  Cardio-vascular:  Positive for chest pain, some sob associated with chest pain.  GI:  No heartburn, indigestion, abdominal pain, nausea, vomiting, diarrhea, change in bowel habits, loss of appetite  Resp:  No shortness of breath with exertion or at rest. No excess mucus, no productive cough, No non-productive cough, No coughing up of blood.No change in color of mucus.No wheezing.No chest wall deformity  Skin:  no rash or lesions.  GU:  no dysuria, change in color of urine, no urgency or frequency. No flank pain.  Musculoskeletal:  Back pain left shoulder pain Psych:  No change in mood or affect. No depression or anxiety. No memory loss.   Past Medical History    Diagnosis Date  . Diabetes mellitus   . Hypertension   . Hypercholesteremia    Past Surgical History  Procedure Laterality Date  . Rectoperitoneal fistula closure     Social History:  reports that he has been passively smoking Cigars.  He does not have any smokeless tobacco history on file. He reports that he does not drink alcohol or use illicit drugs.  No Known Allergies  History reviewed. No pertinent family history.   Prior to Admission medications   Medication Sig Start Date End Date Taking? Authorizing Provider  aspirin 81 MG tablet Take 81 mg by mouth daily.    Yes Historical Provider, MD  fenofibrate (TRICOR) 145 MG tablet Take 145 mg by mouth daily.   Yes Historical Provider, MD  ibuprofen (ADVIL,MOTRIN) 200 MG tablet Take 400 mg by mouth every 6 (six) hours as needed.     Yes Historical Provider, MD  Insulin Aspart (NOVOLOG PENFILL [place]) Inject 10-16 Units into the skin See admin instructions. Sliding scale   Yes Historical Provider, MD  insulin glargine (LANTUS) 100 unit/mL SOPN Inject 40 Units into the skin at bedtime.   Yes Historical Provider, MD  lisinopril (PRINIVIL,ZESTRIL) 20 MG tablet Take 20 mg by mouth daily.     Yes Historical Provider, MD  metFORMIN (GLUCOPHAGE) 500 MG tablet Take 500 mg by mouth at bedtime.   Yes Historical Provider, MD   Physical Exam: Filed Vitals:   02/24/14 1842 02/24/14 1900 02/24/14 1930 02/24/14 2021  BP: 127/71 125/76 122/78 120/70  Pulse:  63 64 62  Temp:    97.8 F (36.6 C)  TempSrc:  Oral  Resp: 21 17 15 16   Height:    5\' 5"  (1.651 m)  Weight:    84.46 kg (186 lb 3.2 oz)  SpO2: 97% 97% 98% 98%    Wt Readings from Last 3 Encounters:  02/24/14 84.46 kg (186 lb 3.2 oz)  07/31/11 83.915 kg (185 lb)    General:  Appears calm and comfortable Eyes: PERRL, normal lids, irises & conjunctiva Neck: no LAD, masses or thyromegaly Cardiovascular: RRR, no m/r/g. No LE edema. Telemetry: SR, no arrhythmias  Respiratory: CTA  bilaterally, no w/r/r. Normal respiratory effort. Abdomen: soft, ntnd Skin: no rash or induration seen on limited exam Musculoskeletal:  the left shoulder  Psychiatric: grossly normal mood and affect, speech fluent and appropriate Neurologic: grossly non-focal.          Labs on Admission:  Basic Metabolic Panel:  Recent Labs Lab 02/24/14 1644  NA 139  K 4.3  CL 102  CO2 23  GLUCOSE 137*  BUN 18  CREATININE 0.87  CALCIUM 9.8   Liver Function Tests: No results found for this basename: AST, ALT, ALKPHOS, BILITOT, PROT, ALBUMIN,  in the last 168 hours No results found for this basename: LIPASE, AMYLASE,  in the last 168 hours No results found for this basename: AMMONIA,  in the last 168 hours CBC:  Recent Labs Lab 02/24/14 1644  WBC 7.2  HGB 14.0  HCT 39.7  MCV 84.1  PLT 257   Cardiac Enzymes: No results found for this basename: CKTOTAL, CKMB, CKMBINDEX, TROPONINI,  in the last 168 hours  BNP (last 3 results) No results found for this basename: PROBNP,  in the last 8760 hours CBG: No results found for this basename: GLUCAP,  in the last 168 hours  Radiological Exams on Admission: Dg Chest 2 View  02/24/2014   CLINICAL DATA:  Left-sided chest pain and left arm pain which began yesterday. Current history of hypertension and diabetes.  EXAM: CHEST  2 VIEW  COMPARISON:  07/31/2011, 05/08/2008.  FINDINGS: Cardiac silhouette normal in size, unchanged. Lungs clear. Bronchovascular markings normal. Pulmonary vascularity normal. No visible pleural effusions. No pneumothorax. Visualized bony thorax intact. No significant interval change  IMPRESSION: No acute cardiopulmonary disease.  Stable examination.   Electronically Signed   By: Hulan Saashomas  Lawrence M.D.   On: 02/24/2014 18:25    EKG:NSR, non specific st t wave changes.   Assessment/Plan Active Problems:   Chest pain   Chest pain at rest   Diabetes mellitus   Hypertension   Hyperlipidemia   1. Atypical Chest Pain:    Tenderness on palpation of the chest wall and between the shoulder blades and left shoulder.  Admit to telemetry. Evaluate for ACS.  EKG shows non specific t wave changes.  Serial troponins and echocardiogram ordered.  Will keep him NPO after midnight for possible stress in am.  Cardiology consulted.  His heart score is 4. D dimer ordered and if elevated please follow up with a CT angio of the chest.     Diabetes mellitus: hgba1c ordered, on lantus, resume home dose. SSI.    Hyperlipidemia: Lipid panel in am. Resume fenofibrate.   Hypertension: controlled. Resume lisinopril.    Code Status: full code DVT Prophylaxis: lovenox Family Communication: family at bedside Disposition Plan: pending  Time spent: 60 minutes.   [facility] Medical CenterKULA,Maygen Sirico [facility] Pager [phone]  **Disclaimer: This note may have been dictated with voice recognition software. Similar sounding words can inadvertently be transcribed and this note may contain transcription  errors which may not have been corrected upon publication of note.**

## 2014-02-24 NOTE — ED Provider Notes (Signed)
CSN: [id number]     Arrival date & time 02/24/14  1637 History   First MD Initiated Contact with Patient 02/24/14 1707     Chief Complaint  Patient presents with  . Chest Pain    HPI Patient presented to the emergency room with complaints of chest pain. Last evening he had an episode of pain in the right side of his chest and right arm that subsequently resolved. Today while at work hit another episode of pain but this time it was in the left chest and it radiated to his left shoulder blade. The pain is been mostly constant for the last few  hours. His breathing feels a little restricted. The patient does notice that the pain increases when he tries to raise his arm. He does not have any trouble with nausea vomiting. He does not have a history of heart disease.  Patient does have history of diabetes hypertension and hypercholesterolemia. He has a brother with a history of coronary artery disease that required bypass grafting.  Patient went to see his primary care doctor today they noticed some changes in his EKG. He was sent to the emergency room for further evaluation. Past Medical History  Diagnosis Date  . Diabetes mellitus   . Hypertension   . Hypercholesteremia    Past Surgical History  Procedure Laterality Date  . Rectoperitoneal fistula closure     History reviewed. No pertinent family history. History  Substance Use Topics  . Smoking status: Passive Smoke Exposure - Never Smoker    Types: Cigars  . Smokeless tobacco: Not on file  . Alcohol Use: No    Review of Systems  All other systems reviewed and are negative.     Allergies  Review of patient's allergies indicates no known allergies.  Home Medications   Prior to Admission medications   Medication Sig Start Date End Date Taking? Authorizing Provider  aspirin 81 MG tablet Take 81 mg by mouth daily.    Yes Historical Provider, MD  fenofibrate (TRICOR) 145 MG tablet Take 145 mg by mouth daily.   Yes Historical  Provider, MD  ibuprofen (ADVIL,MOTRIN) 200 MG tablet Take 400 mg by mouth every 6 (six) hours as needed.     Yes Historical Provider, MD  Insulin Aspart (NOVOLOG PENFILL [place]) Inject 10-16 Units into the skin See admin instructions. Sliding scale   Yes Historical Provider, MD  insulin glargine (LANTUS) 100 unit/mL SOPN Inject 40 Units into the skin at bedtime.   Yes Historical Provider, MD  lisinopril (PRINIVIL,ZESTRIL) 20 MG tablet Take 20 mg by mouth daily.     Yes Historical Provider, MD  metFORMIN (GLUCOPHAGE) 500 MG tablet Take 500 mg by mouth at bedtime.   Yes Historical Provider, MD   BP 127/71  Pulse 65  Temp(Src) 98 F (36.7 C) (Oral)  Resp 21  Wt 188 lb 8 oz (85.503 kg)  SpO2 97% Physical Exam  Nursing note and vitals reviewed. Constitutional: He appears well-developed and well-nourished. No distress.  HENT:  Head: Normocephalic and atraumatic.  Right Ear: External ear normal.  Left Ear: External ear normal.  Eyes: Conjunctivae are normal. Right eye exhibits no discharge. Left eye exhibits no discharge. No scleral icterus.  Neck: Neck supple. No tracheal deviation present.  Cardiovascular: Normal rate, regular rhythm and intact distal pulses.   Pulmonary/Chest: Effort normal and breath sounds normal. No stridor. No respiratory distress. He has no wheezes. He has no rales.  Abdominal: Soft. Bowel sounds are normal. He  exhibits no distension. There is no tenderness. There is no rebound and no guarding.  Musculoskeletal: He exhibits no edema and no tenderness.  Neurological: He is alert. He has normal strength. No cranial nerve deficit (no facial droop, extraocular movements intact, no slurred speech) or sensory deficit. He exhibits normal muscle tone. He displays no seizure activity. Coordination normal.  Skin: Skin is warm and dry. No rash noted.  Psychiatric: He has a normal mood and affect.    ED Course  Procedures (including critical care time) Labs Review Labs Reviewed   BASIC METABOLIC PANEL - Abnormal; Notable for the following:    Glucose, Bld 137 (*)    All other components within normal limits  CBC  APTT  PROTIME-INR  I-STAT TROPOININ, ED  Rosezena Sensor, ED    Imaging Review Dg Chest 2 View  02/24/2014   CLINICAL DATA:  Left-sided chest pain and left arm pain which began yesterday. Current history of hypertension and diabetes.  EXAM: CHEST  2 VIEW  COMPARISON:  07/31/2011, 05/08/2008.  FINDINGS: Cardiac silhouette normal in size, unchanged. Lungs clear. Bronchovascular markings normal. Pulmonary vascularity normal. No visible pleural effusions. No pneumothorax. Visualized bony thorax intact. No significant interval change  IMPRESSION: No acute cardiopulmonary disease.  Stable examination.   Electronically Signed   By: Hulan Saas M.D.   On: 02/24/2014 18:25     EKG Interpretation   Date/Time:  Friday February 24 2014 16:45:46 EDT Ventricular Rate:  74 PR Interval:  174 QRS Duration: 94 QT Interval:  362 QTC Calculation: 401 R Axis:   41 Text Interpretation:  Normal sinus rhythm Minimal voltage criteria for  LVH, may be normal variant , new since prior tracing Nonspecific ST and T  wave abnormality , new in inferior leads compared to prior tracing  Abnormal ECG Confirmed by Simran Mannis  MD-J, Grantley Savage ([id number]) on 02/24/2014 5:50:23 PM      MDM   Final diagnoses:  Chest pain, unspecified chest pain type    Symptoms have some atypical features however pt has significant risk factors and strong family history.  Also with some subtle EKG changes.  Initial troponin normal.  Will admit for monitoring/further evaluation.    Linwood Dibbles, MD 02/24/14 [id number]

## 2014-02-24 NOTE — ED Notes (Signed)
Pt in stating that yesterday he had an episode of right sided chest pain and right arm pain that resolved, today while at work pain returned to left side of chest and radiates into left arm, left shoulder blade, has been constant since it came back, states his breathing feels tight, denies n/v, also c/o headache

## 2014-02-25 ENCOUNTER — Observation Stay (HOSPITAL_COMMUNITY): Payer: Managed Care, Other (non HMO)

## 2014-02-25 DIAGNOSIS — K219 Gastro-esophageal reflux disease without esophagitis: Secondary | ICD-10-CM | POA: Diagnosis present

## 2014-02-25 DIAGNOSIS — E139 Other specified diabetes mellitus without complications: Secondary | ICD-10-CM

## 2014-02-25 DIAGNOSIS — R079 Chest pain, unspecified: Secondary | ICD-10-CM

## 2014-02-25 DIAGNOSIS — K2100 Gastro-esophageal reflux disease with esophagitis, without bleeding: Secondary | ICD-10-CM

## 2014-02-25 LAB — BASIC METABOLIC PANEL
Anion gap: 8 (ref 5–15)
BUN: 23 mg/dL (ref 6–23)
CALCIUM: 9.2 mg/dL (ref 8.4–10.5)
CO2: 26 mEq/L (ref 19–32)
CREATININE: 1.04 mg/dL (ref 0.50–1.35)
Chloride: 104 mEq/L (ref 96–112)
GFR calc Af Amer: >90 mL/min (ref >90)
GFR, EST NON AFRICAN AMERICAN: 81 mL/min — AB (ref >90)
GLUCOSE: 214 mg/dL — AB (ref 70–99)
Potassium: 4.5 mEq/L (ref 3.7–5.3)
SODIUM: 138 meq/L (ref 137–147)

## 2014-02-25 LAB — CBC
HEMATOCRIT: 36.7 % — AB (ref 39.0–52.0)
Hemoglobin: 12.5 g/dL — ABNORMAL LOW (ref 13.0–17.0)
MCH: 29 pg (ref 26.0–34.0)
MCHC: 34.1 g/dL (ref 30.0–36.0)
MCV: 85.2 fL (ref 78.0–100.0)
PLATELETS: 205 10*3/uL (ref 150–400)
RBC: 4.31 MIL/uL (ref 4.22–5.81)
RDW: 12.2 % (ref 11.5–15.5)
WBC: 5.5 10*3/uL (ref 4.0–10.5)

## 2014-02-25 LAB — HEPATIC FUNCTION PANEL
ALT: 14 U/L (ref 0–53)
AST: 11 U/L (ref 0–37)
Albumin: 3.6 g/dL (ref 3.5–5.2)
Alkaline Phosphatase: 45 U/L (ref 39–117)
BILIRUBIN TOTAL: 0.3 mg/dL (ref 0.3–1.2)
Bilirubin, Direct: <0.2 mg/dL (ref 0.0–0.3)
Total Protein: 5.9 g/dL — ABNORMAL LOW (ref 6.0–8.3)

## 2014-02-25 LAB — TROPONIN I
Troponin I: <0.3 ng/mL (ref <0.30)
Troponin I: <0.3 ng/mL (ref <0.30)

## 2014-02-25 LAB — LIPID PANEL
Cholesterol: 190 mg/dL (ref 0–200)
HDL: 43 mg/dL (ref >39)
LDL CALC: 107 mg/dL — AB (ref 0–99)
Total CHOL/HDL Ratio: 4.4 RATIO
Triglycerides: 198 mg/dL — ABNORMAL HIGH (ref <150)
VLDL: 40 mg/dL (ref 0–40)

## 2014-02-25 LAB — GLUCOSE, CAPILLARY
GLUCOSE-CAPILLARY: 184 mg/dL — AB (ref 70–99)
Glucose-Capillary: 161 mg/dL — ABNORMAL HIGH (ref 70–99)

## 2014-02-25 LAB — HEMOGLOBIN A1C
Hgb A1c MFr Bld: 9.4 % — ABNORMAL HIGH (ref <5.7)
Mean Plasma Glucose: 223 mg/dL — ABNORMAL HIGH (ref <117)

## 2014-02-25 MED ORDER — TECHNETIUM TC 99M SESTAMIBI - CARDIOLITE
10.0000 | Freq: Once | INTRAVENOUS | Status: AC | PRN
  Administered 2014-02-25: 10 via INTRAVENOUS

## 2014-02-25 MED ORDER — TRAMADOL HCL 50 MG PO TABS
50.0000 mg | ORAL_TABLET | Freq: Four times a day (QID) | ORAL | Status: DC | PRN
Start: 2014-02-25 — End: 2016-12-14

## 2014-02-25 MED ORDER — TECHNETIUM TC 99M SESTAMIBI - CARDIOLITE
30.0000 | Freq: Once | INTRAVENOUS | Status: AC | PRN
  Administered 2014-02-25: 11:00:00 30 via INTRAVENOUS

## 2014-02-25 MED ORDER — NITROGLYCERIN 0.4 MG SL SUBL: 0.4000 mg | SUBLINGUAL_TABLET | SUBLINGUAL | Status: DC | PRN

## 2014-02-25 MED ORDER — PANTOPRAZOLE SODIUM 40 MG PO TBEC: 40.0000 mg | DELAYED_RELEASE_TABLET | Freq: Every day | ORAL | Status: DC

## 2014-02-25 NOTE — Progress Notes (Signed)
UR Completed.  Marg Macmaster Jane [phone] 02/25/2014  

## 2014-02-25 NOTE — Progress Notes (Signed)
Patient ID: Brandon Walker  male  ZOX:[id number]    DOB: 09-20-1960    DOA: 02/24/2014  PCP: Emeterio Reeve, MD  Assessment/Plan: Principal Problem:   Chest pain; concern for angina equivalent, risk factors being hypertension, diabetes, hyperlipidemia, strong family history his brother had a triple bypass at the age of 3.  - Ruled out for acute ACS, EKG shows Q waves in the inferior leads, cardiology consulted - Patient to have nuclear medicine stress test today, 2-D echo for further workup  Active Problems:    Diabetes mellitus - Hemoglobin A1c 9.4, per patient recently started on insulin, is following endocrinology Dr. Margaretmary Bayley. He was also placed on metformin 2 months ago however he is having significant GI upset with metformin. Metformin was discontinued and patient will continue insulin only. Followup with Dr. Margaretmary Bayley    Hypertension - Continue lisinopril    Hyperlipidemia - Continue TriCor   GERD - Placed on PPI, counseled on not taking NSAIDs. Placed on tramadol as needed, patient has been taking significant ibuprofen every day. Recommended patient to see GI outpatient if his symptoms are not improved and discuss with his PCP.   DVT Prophylaxis:  Code Status:  Family Communication:  Disposition:Hopefully today if workup is negative   Consultants:  Cardiology   Procedures:  Nuclear medicine stress test   Antibiotics:  none    Subjective: Patient seen and examined, no chest pain at this time, however has heartburn/epigastric pain   Objective: Weight change:   Intake/Output Summary (Last 24 hours) at 02/25/14 1005 Last data filed at 02/25/14 0900  Gross per 24 hour  Intake    360 ml  Output    525 ml  Net   -165 ml   Blood pressure 131/76, pulse 54, temperature 97.5 F (36.4 C), temperature source Oral, resp. rate 16, height 5\' 5"  (1.651 m), weight 84.46 kg (186 lb 3.2 oz), SpO2 97.00%.  Physical Exam: General: Alert and awake,  oriented x3, not in any acute distress. CVS: S1-S2 clear, no murmur rubs or gallops Chest: clear to auscultation bilaterally, no wheezing, rales or rhonchi Abdomen: soft  mild epigastric tenderness to deep palpation, nondistended, normal bowel sounds  Extremities: no cyanosis, clubbing or edema noted bilaterally Neuro: Cranial nerves II-XII intact, no focal neurological deficits  Lab Results: Basic Metabolic Panel:  Recent Labs Lab 02/24/14 1644 02/25/14 0403  NA 139 138  K 4.3 4.5  CL 102 104  CO2 23 26  GLUCOSE 137* 214*  BUN 18 23  CREATININE 0.87 1.04  CALCIUM 9.8 9.2   Liver Function Tests:  Recent Labs Lab 02/25/14 0403  AST 11  ALT 14  ALKPHOS 45  BILITOT 0.3  PROT 5.9*  ALBUMIN 3.6   No results found for this basename: LIPASE, AMYLASE,  in the last 168 hours No results found for this basename: AMMONIA,  in the last 168 hours CBC:  Recent Labs Lab 02/24/14 1644 02/25/14 0403  WBC 7.2 5.5  HGB 14.0 12.5*  HCT 39.7 36.7*  MCV 84.1 85.2  PLT 257 205   Cardiac Enzymes:  Recent Labs Lab 02/24/14 2042 02/25/14 0403  TROPONINI <0.30 <0.30   BNP: No components found with this basename: POCBNP,  CBG:  Recent Labs Lab 02/24/14 2025  GLUCAP 103*     Micro Results: No results found for this or any previous visit (from the past 240 hour(s)).  Studies/Results: Dg Chest 2 View  02/24/2014   CLINICAL DATA:  Left-sided chest pain  and left arm pain which began yesterday. Current history of hypertension and diabetes.  EXAM: CHEST  2 VIEW  COMPARISON:  07/31/2011, 05/08/2008.  FINDINGS: Cardiac silhouette normal in size, unchanged. Lungs clear. Bronchovascular markings normal. Pulmonary vascularity normal. No visible pleural effusions. No pneumothorax. Visualized bony thorax intact. No significant interval change  IMPRESSION: No acute cardiopulmonary disease.  Stable examination.   Electronically Signed   By: Hulan Saashomas  Lawrence M.D.   On: 02/24/2014 18:25     Medications: Scheduled Meds: . aspirin  324 mg Oral Once  . aspirin EC  81 mg Oral Daily  . enoxaparin (LOVENOX) injection  40 mg Subcutaneous Q24H  . fenofibrate  160 mg Oral Daily  . insulin aspart  0-9 Units Subcutaneous TID WC  . insulin glargine  40 Units Subcutaneous QHS  . lisinopril  20 mg Oral Daily  . pantoprazole  40 mg Oral Q0600  . pneumococcal 23 valent vaccine  0.5 mL Intramuscular Tomorrow-1000  . sodium chloride  3 mL Intravenous Q12H      LOS: 1 day   RAI,RIPUDEEP M.D. [facility] 02/25/2014, 10:05 AM Pager: [phone]  If 7PM-7AM, please contact night-coverage [email or web address] Password [email or web address]  **Disclaimer: This note was dictated with voice recognition software. Similar sounding words can inadvertently be transcribed and this note may contain transcription errors which may not have been corrected upon publication of note.**

## 2014-02-25 NOTE — Discharge Summary (Signed)
Physician Discharge Summary  Patient ID: Brandon ClickWilberto Stuckey MRN: [id number] DOB/AGE: [birth year] 53 y.o.  Admit date: 02/24/2014 Discharge date: 02/25/2014  Primary Care Physician:  Emeterio ReeveWOLTERS,SHARON A, MD  Discharge Diagnoses:    . Chest pain . Diabetes mellitus . Hypertension . Hyperlipidemia . GERD (gastroesophageal reflux disease)  Consults:  Cardiology   Recommendations for Outpatient Follow-up:  Metformin was discontinued due to GI upset, patient recommended to stay on insulin and discussed with Dr. Margaretmary BayleyPreston Clark  Patient is also recommended outpatient sleep study with pulmonology or neurology  Allergies:  No Known Allergies   Discharge Medications:   Medication List    STOP taking these medications       ibuprofen 200 MG tablet  Commonly known as:  ADVIL,MOTRIN     metFORMIN 500 MG tablet  Commonly known as:  GLUCOPHAGE      TAKE these medications       aspirin 81 MG tablet  Take 81 mg by mouth daily.     fenofibrate 145 MG tablet  Commonly known as:  TRICOR  Take 145 mg by mouth daily.     insulin glargine 100 unit/mL Sopn  Commonly known as:  LANTUS  Inject 40 Units into the skin at bedtime.     lisinopril 20 MG tablet  Commonly known as:  PRINIVIL,ZESTRIL  Take 20 mg by mouth daily.     nitroGLYCERIN 0.4 MG SL tablet  Commonly known as:  NITROSTAT  Place 1 tablet (0.4 mg total) under the tongue every 5 (five) minutes as needed for chest pain.     NOVOLOG PENFILL [place]  Inject 10-16 Units into the skin See admin instructions. Sliding scale     pantoprazole 40 MG tablet  Commonly known as:  PROTONIX  Take 1 tablet (40 mg total) by mouth daily.     traMADol 50 MG tablet  Commonly known as:  ULTRAM  Take 1 tablet (50 mg total) by mouth every 6 (six) hours as needed.         Brief H and P: For complete details please refer to admission H and P, but in brief Brandon Walker is a 53 y.o. male with prior h/o hypertension, Diabetes mellitus,  hyperlipidemia, presented with right sided chest pain occurred at night before the admission radiating to the right. He thought it was gas and let go. On the morning of admission, patient reported left sided chest pain and substernal chest pain radiating to the left arm and between the shoulder blades. In the ER, he was chest pain free. He reported some trouble catching his breathing when having the chest pain. No palpitations, orthopnea, pnd, pedal edema. Has a brother who underwent cabg recently. He also reported tingling numbness of his left arm and left foot more when compared to his right arm and right foot. 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 He denied any other complaints.   Hospital Course:   Chest pain; concern for angina equivalent, risk factors being hypertension, diabetes, hyperlipidemia, strong family history his brother had a triple bypass at the age of 52.  Patient was out for acute ACS, serial troponins negative. EKG showed Q waves in the inferior leads, cardiology was consulted. Cardiology recommended nuclear medicine stress test. D-dimer normal. Stress test was negative for pharmacological stress-induced ischemia, EF 60%.  Diabetes mellitus  - Hemoglobin A1c 9.4, per patient recently started on insulin, is following endocrinology Dr. Margaretmary BayleyPreston Clark. He was also placed on metformin 2 months ago however he is having  significant GI upset with metformin. Metformin was discontinued and patient will continue insulin only. Followup with Dr. Margaretmary Bayley outpatient.  Hypertension  - Continue lisinopril   Hyperlipidemia  - Continue TriCor   GERD  - Placed on PPI, counseled on not taking NSAIDs. Placed on tramadol as needed, patient has been taking significant ibuprofen every day. Recommended patient to see GI outpatient if his symptoms are not improved and discuss with his PCP.      Day of Discharge BP 161/91  Pulse 137  Temp(Src) 97.5 F (36.4 C) (Oral)  Resp 16  Ht 5\' 5"   (1.651 m)  Wt 84.46 kg (186 lb 3.2 oz)  BMI 30.99 kg/m2  SpO2 97%  Physical Exam: General: Alert and awake oriented x3 not in any acute distress. CVS: S1-S2 clear no murmur rubs or gallops Chest: clear to auscultation bilaterally, no wheezing rales or rhonchi Abdomen: soft nontender, nondistended, normal bowel sounds Extremities: no cyanosis, clubbing or edema noted bilaterally Neuro: Cranial nerves II-XII intact, no focal neurological deficits   The results of significant diagnostics from this hospitalization (including imaging, microbiology, ancillary and laboratory) are listed below for reference.    LAB RESULTS: Basic Metabolic Panel:  Recent Labs Lab 02/24/14 1644 02/25/14 0403  NA 139 138  K 4.3 4.5  CL 102 104  CO2 23 26  GLUCOSE 137* 214*  BUN 18 23  CREATININE 0.87 1.04  CALCIUM 9.8 9.2   Liver Function Tests:  Recent Labs Lab 02/25/14 0403  AST 11  ALT 14  ALKPHOS 45  BILITOT 0.3  PROT 5.9*  ALBUMIN 3.6   No results found for this basename: LIPASE, AMYLASE,  in the last 168 hours No results found for this basename: AMMONIA,  in the last 168 hours CBC:  Recent Labs Lab 02/24/14 1644 02/25/14 0403  WBC 7.2 5.5  HGB 14.0 12.5*  HCT 39.7 36.7*  MCV 84.1 85.2  PLT 257 205   Cardiac Enzymes:  Recent Labs Lab 02/25/14 0403 02/25/14 1152  TROPONINI <0.30 <0.30   BNP: No components found with this basename: POCBNP,  CBG:  Recent Labs Lab 02/25/14 0740 02/25/14 1214  GLUCAP 161* 184*    Significant Diagnostic Studies:  Dg Chest 2 View  02/24/2014   CLINICAL DATA:  Left-sided chest pain and left arm pain which began yesterday. Current history of hypertension and diabetes.  EXAM: CHEST  2 VIEW  COMPARISON:  07/31/2011, 05/08/2008.  FINDINGS: Cardiac silhouette normal in size, unchanged. Lungs clear. Bronchovascular markings normal. Pulmonary vascularity normal. No visible pleural effusions. No pneumothorax. Visualized bony thorax intact. No  significant interval change  IMPRESSION: No acute cardiopulmonary disease.  Stable examination.   Electronically Signed   By: Hulan Saas M.D.   On: 02/24/2014 18:25       Disposition and Follow-up: Discharge Instructions   Diet Carb Modified    Complete by:  As directed      Discharge instructions    Complete by:  As directed   Please discuss with Dr Paulino Rily regarding formal sleep study with referral to  [facility] or [facility], Dr Porfirio Mylar Dohmeier.  Please stop taking Ibuprofen.  It is VERY IMPORTANT that you follow up with a PCP on a regular basis.  Check your blood glucoses before each meal and at bedtime and maintain a log of your readings.  Bring this log with you when you follow up with your PCP so that he or she can adjust your insulin at your follow  up visit.     Increase activity slowly    Complete by:  As directed             DISPOSITION: Home  DIET carb modified diet   DISCHARGE FOLLOW-UP Follow-up Information   Follow up with Emeterio Reeve, MD. Schedule an appointment as soon as possible for a visit in 2 weeks. (for hospital follow-up)    Specialty:  Family Medicine   Contact information:   [address] [place] [address] [phone]       Follow up with Laurena Slimmer, MD. Schedule an appointment as soon as possible for a visit in 2 weeks. (for hospital follow-up. Please take the log of your CBG's for further adjustment of insulin regimen.)    Specialty:  Internal Medicine   Contact information:   [address] [place] [address] [phone]       Time spent on Discharge: 35 mins  Signed:   Edouard Gikas M.D. [facility] 02/25/2014, 1:06 PM Pager: [phone]   **Disclaimer: This note was dictated with voice recognition software. Similar sounding words can inadvertently be transcribed and this note may contain transcription errors which may not have been corrected upon  publication of note.**

## 2016-12-14 ENCOUNTER — Encounter (HOSPITAL_COMMUNITY): Payer: Self-pay | Admitting: Emergency Medicine

## 2016-12-14 ENCOUNTER — Encounter (HOSPITAL_COMMUNITY)
Admission: EM | Disposition: A | Discharge status: Home / Self Care | Payer: Self-pay | Source: Home / Self Care | Attending: Emergency Medicine

## 2016-12-14 ENCOUNTER — Emergency Department (HOSPITAL_COMMUNITY)
Admission: EM | Admit: 2016-12-14 | Discharge: 2016-12-14 | Disposition: A | Discharge status: Home / Self Care | Payer: Managed Care, Other (non HMO) | Attending: Emergency Medicine | Admitting: Emergency Medicine

## 2016-12-14 ENCOUNTER — Emergency Department (HOSPITAL_COMMUNITY): Payer: Managed Care, Other (non HMO) | Admitting: Certified Registered Nurse Anesthetist

## 2016-12-14 DIAGNOSIS — E7800 Pure hypercholesterolemia, unspecified: Secondary | ICD-10-CM | POA: Diagnosis not present

## 2016-12-14 DIAGNOSIS — I10 Essential (primary) hypertension: Secondary | ICD-10-CM | POA: Diagnosis not present

## 2016-12-14 DIAGNOSIS — E119 Type 2 diabetes mellitus without complications: Secondary | ICD-10-CM | POA: Diagnosis not present

## 2016-12-14 DIAGNOSIS — N472 Paraphimosis: Secondary | ICD-10-CM

## 2016-12-14 DIAGNOSIS — K219 Gastro-esophageal reflux disease without esophagitis: Secondary | ICD-10-CM | POA: Insufficient documentation

## 2016-12-14 DIAGNOSIS — Z7982 Long term (current) use of aspirin: Secondary | ICD-10-CM | POA: Insufficient documentation

## 2016-12-14 DIAGNOSIS — N471 Phimosis: Secondary | ICD-10-CM | POA: Insufficient documentation

## 2016-12-14 DIAGNOSIS — Z720 Tobacco use: Secondary | ICD-10-CM | POA: Diagnosis not present

## 2016-12-14 DIAGNOSIS — Z794 Long term (current) use of insulin: Secondary | ICD-10-CM | POA: Insufficient documentation

## 2016-12-14 DIAGNOSIS — N478 Other disorders of prepuce: Secondary | ICD-10-CM | POA: Diagnosis not present

## 2016-12-14 HISTORY — PX: CIRCUMCISION: SHX1350

## 2016-12-14 LAB — GLUCOSE, CAPILLARY: Glucose-Capillary: 207 mg/dL — ABNORMAL HIGH (ref 65–99)

## 2016-12-14 SURGERY — CIRCUMCISION, ADULT
Anesthesia: General | Site: Penis

## 2016-12-14 MED ORDER — INSULIN ASPART 100 UNIT/ML ~~LOC~~ SOLN
SUBCUTANEOUS | Status: DC | PRN
  Administered 2016-12-14: 6 [IU] via INTRAVENOUS

## 2016-12-14 MED ORDER — HYDROMORPHONE HCL 1 MG/ML IJ SOLN: 0.2500 mg | INTRAMUSCULAR | Status: DC | PRN

## 2016-12-14 MED ORDER — FENTANYL CITRATE (PF) 100 MCG/2ML IJ SOLN
INTRAMUSCULAR | Status: DC | PRN
  Administered 2016-12-14: 25 ug via INTRAVENOUS
  Administered 2016-12-14: 50 ug via INTRAVENOUS

## 2016-12-14 MED ORDER — BACITRACIN ZINC 500 UNIT/GM EX OINT
TOPICAL_OINTMENT | CUTANEOUS | Status: AC
  Filled 2016-12-14: qty 28.35

## 2016-12-14 MED ORDER — LIDOCAINE 2% (20 MG/ML) 5 ML SYRINGE
INTRAMUSCULAR | Status: DC | PRN
  Administered 2016-12-14: 60 mg via INTRAVENOUS

## 2016-12-14 MED ORDER — 0.9 % SODIUM CHLORIDE (POUR BTL) OPTIME
TOPICAL | Status: DC | PRN
  Administered 2016-12-14: 1000 mL

## 2016-12-14 MED ORDER — PROMETHAZINE HCL 25 MG/ML IJ SOLN: 6.2500 mg | INTRAMUSCULAR | Status: DC | PRN

## 2016-12-14 MED ORDER — DEXAMETHASONE SODIUM PHOSPHATE 10 MG/ML IJ SOLN
INTRAMUSCULAR | Status: AC
  Filled 2016-12-14: qty 1

## 2016-12-14 MED ORDER — SENNOSIDES-DOCUSATE SODIUM 8.6-50 MG PO TABS: 1.0000 | ORAL_TABLET | Freq: Two times a day (BID) | ORAL | 0 refills | Status: DC

## 2016-12-14 MED ORDER — INSULIN ASPART 100 UNIT/ML ~~LOC~~ SOLN
SUBCUTANEOUS | Status: AC
  Filled 2016-12-14: qty 1

## 2016-12-14 MED ORDER — LACTATED RINGERS IV SOLN
INTRAVENOUS | Status: DC | PRN
  Administered 2016-12-14: 15:00:00 via INTRAVENOUS

## 2016-12-14 MED ORDER — LIDOCAINE-PRILOCAINE 2.5-2.5 % EX CREA
TOPICAL_CREAM | Freq: Once | CUTANEOUS | Status: AC
  Administered 2016-12-14: 1 via TOPICAL
  Filled 2016-12-14: qty 5

## 2016-12-14 MED ORDER — CLINDAMYCIN PHOSPHATE 900 MG/50ML IV SOLN
INTRAVENOUS | Status: AC
  Filled 2016-12-14: qty 50

## 2016-12-14 MED ORDER — EPHEDRINE SULFATE 50 MG/ML IJ SOLN
INTRAMUSCULAR | Status: DC | PRN
  Administered 2016-12-14: 5 mg via INTRAVENOUS

## 2016-12-14 MED ORDER — MIDAZOLAM HCL 2 MG/2ML IJ SOLN
INTRAMUSCULAR | Status: AC
  Filled 2016-12-14: qty 2

## 2016-12-14 MED ORDER — PROPOFOL 10 MG/ML IV BOLUS
INTRAVENOUS | Status: DC | PRN
  Administered 2016-12-14: 200 mg via INTRAVENOUS

## 2016-12-14 MED ORDER — ONDANSETRON HCL 4 MG/2ML IJ SOLN
INTRAMUSCULAR | Status: AC
  Filled 2016-12-14: qty 2

## 2016-12-14 MED ORDER — EPHEDRINE 5 MG/ML INJ
INTRAVENOUS | Status: AC
  Filled 2016-12-14: qty 10

## 2016-12-14 MED ORDER — MIDAZOLAM HCL 5 MG/5ML IJ SOLN
INTRAMUSCULAR | Status: DC | PRN
  Administered 2016-12-14: 2 mg via INTRAVENOUS

## 2016-12-14 MED ORDER — ONDANSETRON HCL 4 MG/2ML IJ SOLN
INTRAMUSCULAR | Status: DC | PRN
  Administered 2016-12-14: 4 mg via INTRAVENOUS

## 2016-12-14 MED ORDER — LIDOCAINE 2% (20 MG/ML) 5 ML SYRINGE
INTRAMUSCULAR | Status: AC
  Filled 2016-12-14: qty 5

## 2016-12-14 MED ORDER — OXYCODONE-ACETAMINOPHEN 5-325 MG PO TABS: 2.0000 | ORAL_TABLET | Freq: Four times a day (QID) | ORAL | 0 refills | Status: AC | PRN

## 2016-12-14 MED ORDER — HYDROMORPHONE HCL 1 MG/ML IJ SOLN
1.0000 mg | Freq: Once | INTRAMUSCULAR | Status: AC
  Administered 2016-12-14: 1 mg via INTRAMUSCULAR
  Filled 2016-12-14: qty 1

## 2016-12-14 MED ORDER — FENTANYL CITRATE (PF) 100 MCG/2ML IJ SOLN
INTRAMUSCULAR | Status: AC
  Filled 2016-12-14: qty 2

## 2016-12-14 MED ORDER — LIDOCAINE HCL (PF) 1 % IJ SOLN
INTRAMUSCULAR | Status: DC | PRN
  Administered 2016-12-14: 10 mL

## 2016-12-14 MED ORDER — PROPOFOL 10 MG/ML IV BOLUS
INTRAVENOUS | Status: AC
  Filled 2016-12-14: qty 20

## 2016-12-14 MED ORDER — LIDOCAINE HCL 1 % IJ SOLN
INTRAMUSCULAR | Status: AC
  Filled 2016-12-14: qty 20

## 2016-12-14 MED ORDER — CLINDAMYCIN PHOSPHATE 900 MG/50ML IV SOLN
900.0000 mg | Freq: Once | INTRAVENOUS | Status: AC
  Administered 2016-12-14: 900 mg via INTRAVENOUS

## 2016-12-14 SURGICAL SUPPLY — 28 items
BANDAGE COBAN STERILE 2 (GAUZE/BANDAGES/DRESSINGS) ×3 IMPLANT
BLADE SURG 15 STRL LF DISP TIS (BLADE) ×1 IMPLANT
BLADE SURG 15 STRL SS (BLADE) ×3
BNDG COHESIVE 1X5 TAN STRL LF (GAUZE/BANDAGES/DRESSINGS) ×2 IMPLANT
BNDG CONFORM 2 STRL LF (GAUZE/BANDAGES/DRESSINGS) ×3 IMPLANT
DECANTER SPIKE VIAL GLASS SM (MISCELLANEOUS) ×2 IMPLANT
DRAPE LAPAROTOMY T 102X78X121 (DRAPES) ×3 IMPLANT
ELECT NDL TIP 2.8 STRL (NEEDLE) ×1 IMPLANT
ELECT NEEDLE TIP 2.8 STRL (NEEDLE) ×3 IMPLANT
ELECT PENCIL ROCKER SW 15FT (MISCELLANEOUS) ×3 IMPLANT
ELECT REM PT RETURN 15FT ADLT (MISCELLANEOUS) ×3 IMPLANT
GAUZE SPONGE 4X4 12PLY STRL (GAUZE/BANDAGES/DRESSINGS) ×3 IMPLANT
GAUZE SPONGE 4X4 16PLY XRAY LF (GAUZE/BANDAGES/DRESSINGS) ×3 IMPLANT
GLOVE BIOGEL M STRL SZ7.5 (GLOVE) ×3 IMPLANT
GOWN STRL REUS W/TWL LRG LVL3 (GOWN DISPOSABLE) ×6 IMPLANT
KIT BASIN OR (CUSTOM PROCEDURE TRAY) ×3 IMPLANT
NDL HYPO 25X1 1.5 SAFETY (NEEDLE) IMPLANT
NEEDLE HYPO 25X1 1.5 SAFETY (NEEDLE) ×3 IMPLANT
NS IRRIG 1000ML POUR BTL (IV SOLUTION) ×2 IMPLANT
PACK BASIC VI WITH GOWN DISP (CUSTOM PROCEDURE TRAY) ×3 IMPLANT
SUT CHROMIC 4 0 P 3 18 (SUTURE) ×2 IMPLANT
SUT CHROMIC 4 0 PS 2 18 (SUTURE) ×2 IMPLANT
SUT VIC AB 2-0 SH 27 (SUTURE) ×3
SUT VIC AB 2-0 SH 27X BRD (SUTURE) IMPLANT
SUT VIC AB 3-0 SH 18 (SUTURE) ×2 IMPLANT
SYR CONTROL 10ML LL (SYRINGE) IMPLANT
TOWEL OR 17X26 10 PK STRL BLUE (TOWEL DISPOSABLE) ×3 IMPLANT
WATER STERILE IRR 1500ML POUR (IV SOLUTION) IMPLANT

## 2016-12-14 NOTE — H&P (Signed)
Brandon Walker is an 56 y.o. male.    Chief Complaint: Severe Phimosis  HPI:   1 - Severe Phimosis - - pt with inability to reduce foreskin x >12 hours with painful glans swelling. He is diabetic and with long h/o phimosis. Multiple attempts at reduction with IV pain meds, emla, topical sugar not successful. Last mean > 8 hours ago.,  PMH sig for HLD, HTN, C spine injury with LUE deficit.  Today "Brandon Walker" is seen for urgent evaluation of above.   Past Medical History:  Diagnosis Date  . Diabetes mellitus   . Hypercholesteremia   . Hypertension     Past Surgical History:  Procedure Laterality Date  . RECTOPERITONEAL FISTULA CLOSURE      No family history on file. Social History:  reports that he is a non-smoker but has been exposed to tobacco smoke. He uses smokeless tobacco. He reports that he does not drink alcohol or use drugs.  Allergies: No Known Allergies   (Not in a hospital admission)  No results found for this or any previous visit (from the past 48 hour(s)). No results found.  Review of Systems  Constitutional: Negative.  Negative for chills and fever.  HENT: Negative.   Eyes: Negative.   Respiratory: Negative.   Cardiovascular: Negative.   Gastrointestinal: Negative.   Genitourinary: Negative for flank pain and hematuria.  Musculoskeletal: Negative.   Skin: Negative.   Neurological: Negative.   Endo/Heme/Allergies: Negative.   Psychiatric/Behavioral: Negative.     Blood pressure 140/74, pulse 99, temperature 97.8 F (36.6 C), temperature source Oral, resp. rate 16, SpO2 98 %. Physical Exam  Constitutional: He appears well-developed.  Family at bedside.   HENT:  Head: Normocephalic.  Eyes: Pupils are equal, round, and reactive to light.  Neck: Normal range of motion.  Cardiovascular: Normal rate.   Respiratory: Effort normal.  GI: Soft.  Genitourinary:  Genitourinary Comments: Severe glans erythem and swelling from phimosis with very tight  phimotic ring. NO frank necrosis.   Musculoskeletal: Normal range of motion.  Neurological: He is alert.  Skin: Skin is warm.  Psychiatric: He has a normal mood and affect.     Assessment/Plan   1 - Severe Phimosis - Discussed options of penile block and attempt reduction v. Proceed with circumcision and he opts for latter. I agree as this solves acute and chronic problem both. Risks, benefits, alternatives, expected peri-op course discussed. LIkely DC home post-op.   Sebastian AcheMANNY, Kierria Feigenbaum, MD 12/14/2016, 3:01 PM

## 2016-12-14 NOTE — Transfer of Care (Signed)
Immediate Anesthesia Transfer of Care Note  Patient: Brandon Walker  Procedure(s) Performed: Procedure(s): CIRCUMCISION ADULT, PENILE BLOCK (N/A)  Patient Location: PACU  Anesthesia Type:General  Level of Consciousness:  sedated, patient cooperative and responds to stimulation  Airway & Oxygen Therapy:Patient Spontanous Breathing and Patient connected to face mask oxgen  Post-op Assessment:  Report given to PACU RN and Post -op Vital signs reviewed and stable  Post vital signs:  Reviewed and stable  Last Vitals:  Vitals:   12/14/16 1051 12/14/16 1449  BP: (!) 153/93 140/74  Pulse: 71 99  Resp: 16 16  Temp: 36.6 C     Complications: No apparent anesthesia complications

## 2016-12-14 NOTE — Progress Notes (Signed)
PACU NURSING NOTE:  Pt has received total of IV LR in OR/PACU. Awake, able to ambulate without assistance, unable to void at this time. Bladder scan performed, noted.

## 2016-12-14 NOTE — Progress Notes (Signed)
PACU NURSING NOTE: DC note: pt alert and oriented x 4, able to ambulate w/o assistance, pain under control, tolerates PO fluids well. VSS. DC instructions provided to pt and wife. Pt teaching done re: dsg care, post op diet, care for next 24 hours per anesthesia, making and keeping following up MD appointment. Safety while taking PO narcotics and signs of infection. Also when to call MD as instructed by primary MD. Opportunity for questions provided, teach back method used. Pt escorted with wife to exit via wheelchair.

## 2016-12-14 NOTE — Anesthesia Preprocedure Evaluation (Signed)
Anesthesia Evaluation  Patient identified by MRN, date of birth, ID band Patient awake    Reviewed: Allergy & Precautions, NPO status , Patient's Chart, lab work & pertinent test results  Airway Mallampati: II  TM Distance: >3 FB Neck ROM: Full    Dental  (+) Dental Advisory Given   Pulmonary neg pulmonary ROS,    breath sounds clear to auscultation       Cardiovascular hypertension, Pt. on medications  Rhythm:Regular Rate:Normal     Neuro/Psych negative neurological ROS     GI/Hepatic negative GI ROS, Neg liver ROS,   Endo/Other  diabetes, Type 2, Insulin Dependent  Renal/GU negative Renal ROS     Musculoskeletal   Abdominal   Peds  Hematology negative hematology ROS (+)   Anesthesia Other Findings   Reproductive/Obstetrics                             Anesthesia Physical Anesthesia Plan  ASA: II  Anesthesia Plan: General   Post-op Pain Management:    Induction: Intravenous  Airway Management Planned: LMA  Additional Equipment:   Intra-op Plan:   Post-operative Plan: Extubation in OR  Informed Consent: I have reviewed the patients History and Physical, chart, labs and discussed the procedure including the risks, benefits and alternatives for the proposed anesthesia with the patient or authorized representative who has indicated his/her understanding and acceptance.   Dental advisory given  Plan Discussed with: CRNA  Anesthesia Plan Comments:         Anesthesia Quick Evaluation

## 2016-12-14 NOTE — ED Triage Notes (Signed)
Patient here from home with complaints of penis injury. Reports that he thinks that he pulled his foreskin back to hard and now there is pain.

## 2016-12-14 NOTE — ED Notes (Signed)
To OR Now

## 2016-12-14 NOTE — Brief Op Note (Signed)
12/14/2016  4:11 PM  PATIENT:  Jolaine ClickWilberto Tetterton  56 y.o. male  PRE-OPERATIVE DIAGNOSIS:  retracted foreskin   POST-OPERATIVE DIAGNOSIS:  retracted foreskin with edema  PROCEDURE:  Procedure(s): CIRCUMCISION ADULT, PENILE BLOCK (N/A)  SURGEON:  Surgeon(s) and Role:    * Sebastian AcheManny, Almarosa Bohac, MD - Primary  PHYSICIAN ASSISTANT:   ASSISTANTS: none   ANESTHESIA:   local and general  EBL:  No intake/output data recorded.  BLOOD ADMINISTERED:none  DRAINS: none   LOCAL MEDICATIONS USED:  LIDOCAINE   SPECIMEN:  Source of Specimen:  phimotic foreskin  DISPOSITION OF SPECIMEN:  PATHOLOGY  COUNTS:  YES  TOURNIQUET:  * No tourniquets in log *  DICTATION: .Other Dictation: Dictation Number [id number]  PLAN OF CARE: Discharge to home after PACU  PATIENT DISPOSITION:  PACU - hemodynamically stable.   Delay start of Pharmacological VTE agent (>24hrs) due to surgical blood loss or risk of bleeding: yes

## 2016-12-14 NOTE — Progress Notes (Signed)
PACU NURSING NOTE: MD notified of [place] results and IVF intake, per MD, pt may be discharged home without voiding. Pt encouraged to continue to stay hydrated once at home, if still unable to void after 5 hours of being home, to call and notify MD.

## 2016-12-14 NOTE — ED Notes (Signed)
Bed: WTR8 Expected date:  Expected time:  Means of arrival:  Comments: 

## 2016-12-14 NOTE — ED Notes (Signed)
Dr. Clarene DukeLittle has just attempted to reduce foreskin without success. Ice reapplied; also, topical sugar applied to the most edematous area of foreskin.

## 2016-12-14 NOTE — Anesthesia Procedure Notes (Signed)
Procedure Name: LMA Insertion Date/Time: 12/14/2016 3:49 PM Performed by: Wynonia SoursWALKER, Stevan Eberwein L Pre-anesthesia Checklist: Patient identified, Emergency Drugs available, Suction available, Patient being monitored and Timeout performed Patient Re-evaluated:Patient Re-evaluated prior to inductionOxygen Delivery Method: Circle system utilized Preoxygenation: Pre-oxygenation with 100% oxygen Intubation Type: IV induction Ventilation: Mask ventilation without difficulty LMA: LMA with gastric port inserted LMA Size: 4.0 Number of attempts: 1 Placement Confirmation: positive ETCO2 and breath sounds checked- equal and bilateral Tube secured with: Tape Dental Injury: Teeth and Oropharynx as per pre-operative assessment

## 2016-12-14 NOTE — Anesthesia Postprocedure Evaluation (Signed)
Anesthesia Post Note  Patient: Brandon Walker  Procedure(s) Performed: Procedure(s) (LRB): CIRCUMCISION ADULT, PENILE BLOCK (N/A)  Patient location during evaluation: PACU Anesthesia Type: General Level of consciousness: awake and alert Pain management: pain level controlled Vital Signs Assessment: post-procedure vital signs reviewed and stable Respiratory status: spontaneous breathing, nonlabored ventilation, respiratory function stable and patient connected to nasal cannula oxygen Cardiovascular status: blood pressure returned to baseline and stable Postop Assessment: no signs of nausea or vomiting Anesthetic complications: no       Last Vitals:  Vitals:   12/14/16 1645 12/14/16 1730  BP: (!) 137/94 (!) 174/97  Pulse:  65  Resp:  14  Temp: 36.5 C     Last Pain:  Vitals:   12/14/16 1630  TempSrc:   PainSc: [name]                 [name]

## 2016-12-14 NOTE — ED Provider Notes (Signed)
[facility]-EMERGENCY DEPT Provider Note   CSN: [id number] Arrival date & time: 12/14/16  1045     History   Chief Complaint Chief Complaint  Patient presents with  . Groin Swelling    HPI Brandon Walker is a 56 y.o. male.  56 year old male with past medical history including hypertension, hyperlipidemia, type 2 diabetes mellitus who presents with penile swelling and pain. This morning he retracted his foreskin to routinely clean and the force again got stuck in a retracted position. The tip of his penis is swollen and become very painful. He has not been able to manually reduce it. He has never had this trouble before. He does report intermittent episodes of yeast infection, last episode was a few months ago. He has been able to urinate since his symptoms began. He denies any abdominal pain. No trauma to his groin.   The history is provided by the patient.    Past Medical History:  Diagnosis Date  . Diabetes mellitus   . Hypercholesteremia   . Hypertension     Patient Active Problem List   Diagnosis Date Noted  . GERD (gastroesophageal reflux disease) 02/25/2014  . Chest pain 02/24/2014  . Chest pain at rest 02/24/2014  . Diabetes mellitus (HCC) 02/24/2014  . Hypertension 02/24/2014  . Hyperlipidemia 02/24/2014    Past Surgical History:  Procedure Laterality Date  . RECTOPERITONEAL FISTULA CLOSURE         Home Medications    Prior to Admission medications   Medication Sig Start Date End Date Taking? Authorizing Provider  aspirin 81 MG tablet Take 81 mg by mouth daily.     [provider]  fenofibrate (TRICOR) 145 MG tablet Take 145 mg by mouth daily.    [provider]  Insulin Aspart (NOVOLOG PENFILL [place]) Inject 10-16 Units into the skin See admin instructions. Sliding scale    [provider]  insulin glargine (LANTUS) 100 unit/mL SOPN Inject 40 Units into the skin at bedtime.    [provider]  lisinopril (PRINIVIL,ZESTRIL)  20 MG tablet Take 20 mg by mouth daily.      [provider]  nitroGLYCERIN (NITROSTAT) 0.4 MG SL tablet Place 1 tablet (0.4 mg total) under the tongue every 5 (five) minutes as needed for chest pain. 02/25/14   Rai, Ripudeep K, MD  pantoprazole (PROTONIX) 40 MG tablet Take 1 tablet (40 mg total) by mouth daily. 02/25/14   Rai, Delene Ruffiniipudeep K, MD  traMADol (ULTRAM) 50 MG tablet Take 1 tablet (50 mg total) by mouth every 6 (six) hours as needed. 02/25/14   Cathren Harshai, Ripudeep K, MD    Family History No family history on file.  Social History Social History  Substance Use Topics  . Smoking status: Passive Smoke Exposure - Never Smoker    Types: Cigars  . Smokeless tobacco: Current User  . Alcohol use No     Allergies   Patient has no known allergies.   Review of Systems Review of Systems All other systems reviewed and are negative except that which was mentioned in HPI  Physical Exam Updated Vital Signs BP 140/74   Pulse 99   Temp 97.8 F (36.6 C) (Oral)   Resp 16   SpO2 98%   Physical Exam  Constitutional: He is oriented to person, place, and time. He appears well-developed and well-nourished. No distress.  HENT:  Head: Normocephalic and atraumatic.  Eyes: Conjunctivae are normal.  Neck: Neck supple.  Abdominal: Soft. Bowel sounds are normal. He  exhibits no distension. There is no tenderness.  Genitourinary:  Genitourinary Comments: Erythema and edema of glans and foreskin of penis with foreskin stuck in retracted position  Chaperone was present during exam.   Neurological: He is alert and oriented to person, place, and time.  Skin: Skin is warm and dry.  Psychiatric: He has a normal mood and affect. Judgment normal.  Nursing note and vitals reviewed.    ED Treatments / Results  Labs (all labs ordered are listed, but only abnormal results are displayed) Labs Reviewed - No data to display  EKG  EKG Interpretation None       Radiology No results  found.  Procedures Procedures (including critical care time)  Medications Ordered in ED Medications  clindamycin (CLEOCIN) IVPB 900 mg (not administered)  clindamycin (CLEOCIN) 900 MG/50ML IVPB (not administered)  lidocaine-prilocaine (EMLA) cream (1 application Topical Given 12/14/16 1210)  HYDROmorphone (DILAUDID) injection 1 mg (1 mg Intramuscular Given 12/14/16 1210)     Initial Impression / Assessment and Plan / ED Course  I have reviewed the triage vital signs and the nursing notes.     PT w/ paraphimosis beginning this morning. No necrosis of distal penis on exam. Applied EMLA cream and ice and gave IM dilaudid prior to attempting manual reduction.  Manual reduction unsuccessful past tight phimotic ring even after EMLA, ice, sugar. Discussed with urology, Dr. Berneice Heinrich who evaluated pt and discussed options. Pt will proceed to OR for circumcision.  Final Clinical Impressions(s) / ED Diagnoses   Final diagnoses:  Paraphimosis    New Prescriptions New Prescriptions   No medications on file     Little, Ambrose Finland, MD 12/14/16 1525

## 2016-12-14 NOTE — Discharge Instructions (Signed)
1 - You may have penile redness and swelling that will slowly improve over next few weeks. Dissolvable stitches will go away on their own over period of few weeks as well.   2 - Continue topical bacitracin ointment daily until stitches dissolved.  3 - You may remove dressing on Monday morning at home and resume showering Monday.  4 -Call MD or go to ER for fever >102, severe pain / nausea / vomiting not relieved by medications, or acute change in medical status

## 2016-12-15 ENCOUNTER — Encounter (HOSPITAL_COMMUNITY): Payer: Self-pay | Admitting: Urology

## 2016-12-15 LAB — GLUCOSE, CAPILLARY: Glucose-Capillary: 243 mg/dL — ABNORMAL HIGH (ref 65–99)

## 2016-12-15 NOTE — Op Note (Signed)
[name]:  Mcfadyen, Berlie          ACCOUNT NO.:  [id number]  MEDICAL RECORD NO.:  [id number]  LOCATION:                                 FACILITY:  PHYSICIAN:  Sebastian Acheheodore Raniyah Curenton, MD          DATE OF BIRTH:  DATE OF PROCEDURE:  12/14/2016                              OPERATIVE REPORT   DIAGNOSIS:  Severe phimosis.  PROCEDURE:  Circumcision and penile block.  ESTIMATED BLOOD LOSS:  Nil.  COMPLICATION:  None.  SPECIMEN:  Phimotic foreskin for permanent pathology.  FINDINGS:  Severe phimosis.  Pre-circumcision and complete resolution of phimosis, but with persistent glans edema improved post-circumcision.  INDICATION:  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  He does have a history of diabetes, several attempts at reduction were performed in the emergency room including with EMLA cream and whipped sugar and with manual decompression is unsuccessful.  Options were discussed including dorsal slit versus penile block and manual reduction versus proceeding with circumcision, he wished to proceed with the latter.  Informed consent was obtained and placed in the medical record.  PROCEDURE IN DETAIL:  The patient being [place] Brandon Walker was verified. Procedure being circumcision confirmed.  Procedure was carried out. Time-out was performed.  Intravenous antibiotics were administered. General anesthesia was introduced.  The patient was placed into a supine position and sterile field was created by prepping and draping the patient's penis, perineum and proximal thighs using iodine.  A proximal circumferential incision was made approximately what appeared to be to the area of corresponding to the unstretched corona of the glans and a distal collar was made approximately 8 mm proximal  to corona of the glans.  These were connected in the 12 o'clock position and coldly incised and the redundant phimotic ring was dissected with cautery, this resulted in complete resolution of fibrotic ring and instantaneously, there has improved in glans, hyperemia and edema.  Next, interrupted Vicryl technique was used circumferentially reapproximating the proximal and dorsal collars into anatomic orientation.  Hemostasis appeared excellent.  Attention was then directed to penile block.  A 10 mL of 1% plain lidocaine was instilled in ring block-type fashion at the base of the penis.  Next, dressing of loose, clean Coban was applied and procedure was terminated.  The patient tolerated the procedure well.  There were no immediate periprocedural complications. The patient was taken to the postanesthesia care unit in stable condition with plan for discharge to home.          ______________________________ Sebastian Acheheodore Analeigh Aries, MD     TM/MEDQ  D:  12/14/2016  T:  12/14/2016  Job:  [id number]

## 2019-10-21 ENCOUNTER — Emergency Department (HOSPITAL_COMMUNITY)
Admission: EM | Admit: 2019-10-21 | Discharge: 2019-10-21 | Disposition: A | Discharge status: Home / Self Care | Payer: 59 | Attending: Emergency Medicine | Admitting: Emergency Medicine

## 2019-10-21 ENCOUNTER — Other Ambulatory Visit: Payer: Self-pay

## 2019-10-21 ENCOUNTER — Encounter (HOSPITAL_COMMUNITY): Payer: Self-pay | Admitting: Pediatrics

## 2019-10-21 DIAGNOSIS — Z5321 Procedure and treatment not carried out due to patient leaving prior to being seen by health care provider: Secondary | ICD-10-CM | POA: Diagnosis not present

## 2019-10-21 DIAGNOSIS — R739 Hyperglycemia, unspecified: Secondary | ICD-10-CM | POA: Diagnosis not present

## 2019-10-21 LAB — CBC
HCT: 43.1 % (ref 39.0–52.0)
Hemoglobin: 14.5 g/dL (ref 13.0–17.0)
MCH: 29.5 pg (ref 26.0–34.0)
MCHC: 33.6 g/dL (ref 30.0–36.0)
MCV: 87.8 fL (ref 80.0–100.0)
Platelets: 208 10*3/uL (ref 150–400)
RBC: 4.91 MIL/uL (ref 4.22–5.81)
RDW: 12.6 % (ref 11.5–15.5)
WBC: 6.9 10*3/uL (ref 4.0–10.5)
nRBC: 0 % (ref 0.0–0.2)

## 2019-10-21 LAB — BASIC METABOLIC PANEL
Anion gap: 10 (ref 5–15)
BUN: 16 mg/dL (ref 6–20)
CO2: 25 mmol/L (ref 22–32)
Calcium: 9.3 mg/dL (ref 8.9–10.3)
Chloride: 97 mmol/L — ABNORMAL LOW (ref 98–111)
Creatinine, Ser: 0.87 mg/dL (ref 0.61–1.24)
GFR calc Af Amer: >60 mL/min (ref >60)
GFR calc non Af Amer: >60 mL/min (ref >60)
Glucose, Bld: 304 mg/dL — ABNORMAL HIGH (ref 70–99)
Potassium: 4.5 mmol/L (ref 3.5–5.1)
Sodium: 132 mmol/L — ABNORMAL LOW (ref 135–145)

## 2019-10-21 LAB — URINALYSIS, ROUTINE W REFLEX MICROSCOPIC
Bilirubin Urine: NEGATIVE
Glucose, UA: >=500 mg/dL — AB
Hgb urine dipstick: NEGATIVE
Ketones, ur: NEGATIVE mg/dL
Leukocytes,Ua: NEGATIVE
Nitrite: NEGATIVE
Protein, ur: NEGATIVE mg/dL
Specific Gravity, Urine: 1.023 (ref 1.005–1.030)
pH: 5 (ref 5.0–8.0)

## 2019-10-21 LAB — CBG MONITORING, ED: Glucose-Capillary: 282 mg/dL — ABNORMAL HIGH (ref 70–99)

## 2019-10-21 NOTE — ED Triage Notes (Signed)
C/O high blood sugar and stated he takes trulicity once a week; Pt c/o abdominal pain and generalized weakness as well

## 2019-10-21 NOTE — ED Notes (Signed)
Pt stated t registration that he has not been seen and is not waiting any longer. LWBS

## 2019-10-25 LAB — I-STAT BETA HCG BLOOD, ED (MC, WL, AP ONLY): I-stat hCG, quantitative: <5 m[IU]/mL (ref <5)

## 2020-04-12 ENCOUNTER — Telehealth (HOSPITAL_COMMUNITY): Payer: Self-pay | Admitting: Nurse Practitioner

## 2020-04-12 DIAGNOSIS — U071 COVID-19: Secondary | ICD-10-CM

## 2020-04-12 NOTE — Telephone Encounter (Signed)
Called to Discuss with patient about Covid symptoms and the use of regeneron, a monoclonal antibody infusion for those with mild to moderate Covid symptoms and at a high risk of hospitalization.     Pt is qualified for this infusion at the [facility] infusion center due to co-morbid conditions and/or a member of an at-risk group.     Unable to reach pt. Left message to return call. Sent text.   Jazel Nimmons, DNP, AGNP-C [phone] (Infusion Center Hotline)  

## 2020-04-14 ENCOUNTER — Telehealth (HOSPITAL_COMMUNITY): Payer: Self-pay | Admitting: Nurse Practitioner

## 2020-04-14 ENCOUNTER — Telehealth: Payer: Self-pay | Admitting: Unknown Physician Specialty

## 2020-04-14 DIAGNOSIS — U071 COVID-19: Secondary | ICD-10-CM

## 2020-04-14 NOTE — Telephone Encounter (Signed)
Called to Discuss with patient about Covid symptoms and the use of the monoclonal antibody infusion for those with mild to moderate Covid symptoms and at a high risk of hospitalization.     Pt appears to qualify for this infusion due to co-morbid conditions and/or a member of an at-risk group in accordance with the FDA Emergency Use Authorization.    Unable to reach pt   Second attempt

## 2020-04-14 NOTE — Telephone Encounter (Signed)
Called to Discuss with patient about Covid symptoms and the use of regeneron, a monoclonal antibody infusion for those with mild to moderate Covid symptoms and at a high risk of hospitalization.     Pt is qualified for this infusion at the [facility] infusion center due to co-morbid conditions and/or a member of an at-risk group.     Unable to reach pt. Left message to return call  Debbie Bellucci, DNP, AGNP-C [phone] (Infusion Center Hotline)  

## 2020-10-12 ENCOUNTER — Telehealth: Payer: Self-pay

## 2020-10-12 NOTE — Telephone Encounter (Signed)
Referral notes sent from [facility] [place], Dr. Mila Palmer, Phone #: [phone], Fax #: [phone]   Notes sent to scheduling

## 2020-11-20 ENCOUNTER — Ambulatory Visit: Payer: No Typology Code available for payment source | Admitting: Cardiology

## 2020-11-20 ENCOUNTER — Other Ambulatory Visit: Payer: Self-pay

## 2020-11-20 ENCOUNTER — Encounter: Payer: Self-pay | Admitting: Cardiology

## 2020-11-20 VITALS — BP 142/84 | HR 69 | Ht 65.0 in | Wt 178.6 lb

## 2020-11-20 DIAGNOSIS — R011 Cardiac murmur, unspecified: Secondary | ICD-10-CM

## 2020-11-20 DIAGNOSIS — I10 Essential (primary) hypertension: Secondary | ICD-10-CM | POA: Diagnosis not present

## 2020-11-20 DIAGNOSIS — Z794 Long term (current) use of insulin: Secondary | ICD-10-CM | POA: Diagnosis not present

## 2020-11-20 DIAGNOSIS — E1169 Type 2 diabetes mellitus with other specified complication: Secondary | ICD-10-CM | POA: Diagnosis not present

## 2020-11-20 MED ORDER — LISINOPRIL 40 MG PO TABS: 40.0000 mg | ORAL_TABLET | Freq: Every day | ORAL | 3 refills | Status: AC

## 2020-11-20 NOTE — Progress Notes (Signed)
Electrophysiology Office Note:    Date:  11/20/2020   ID:  Brandon Walker, DOB 06/17/1961, MRN [id number]  PCP:  Mila Palmer, MD  [facility] Cardiologist:  No primary care provider on file.  [facility] Electrophysiologist:  Lanier Prude, MD   Referring MD: Mila Palmer, MD   Chief Complaint: Fatigue and heart murmur  History of Present Illness:    Brandon Walker is a 60 y.o. male who presents for an evaluation of fatigue and heart murmur at the request of Dr. Paulino Rily. Their medical history includes hypertension, hypercholesterolemia and diabetes.  The patient was last seen by Dr. Laurine Blazer on October 08, 2020.  He uses an insulin pump for his diabetes.  Has a history of chest pain with a negative stress test in 2015.  During that appointment a systolic murmur (grade 3/6) was detected. He has a strong family history of coronary artery disease.  He lays flat at night.  No fluid accumulation.  No heart failure symptoms.  No syncope.  Past Medical History:  Diagnosis Date  . Diabetes mellitus   . Hypercholesteremia   . Hypertension     Past Surgical History:  Procedure Laterality Date  . CIRCUMCISION N/A 12/14/2016   Procedure: CIRCUMCISION ADULT, PENILE BLOCK;  Surgeon: Sebastian Ache, MD;  Location: [facility];  Service: Urology;  Laterality: N/A;  . RECTOPERITONEAL FISTULA CLOSURE      Current Medications: Current Meds  Medication Sig  . albuterol (VENTOLIN HFA) 108 (90 Base) MCG/ACT inhaler Inhale 1 puff into the lungs as needed for shortness of breath.  Marland Kitchen aspirin EC 81 MG tablet Take 81 mg by mouth daily. Swallow whole.  . DULoxetine (CYMBALTA) 30 MG capsule Take 30 mg by mouth daily.  Marland Kitchen gabapentin (NEURONTIN) 300 MG capsule Take 2 capsules by mouth 3 (three) times daily.  Marland Kitchen lisinopril (ZESTRIL) 40 MG tablet Take 1 tablet (40 mg total) by mouth daily.  Marland Kitchen NAPROXEN PO Take 1 tablet by mouth as needed (pain).  . predniSONE (DELTASONE) 10 MG tablet Take 10 mg  by mouth daily with breakfast.  . Vitamin D, Ergocalciferol, (DRISDOL) 1.25 MG (50000 UNIT) CAPS capsule Take 1 capsule by mouth once a week.  . [DISCONTINUED] lisinopril (PRINIVIL,ZESTRIL) 20 MG tablet Take 20 mg by mouth daily.     Allergies:   Patient has no known allergies.   Social History   Socioeconomic History  . Marital status: Married    Spouse name: Not on file  . Number of children: Not on file  . Years of education: Not on file  . Highest education level: Not on file  Occupational History  . Not on file  Tobacco Use  . Smoking status: Passive Smoke Exposure - Never Smoker  . Smokeless tobacco: Current User  Substance and Sexual Activity  . Alcohol use: No  . Drug use: No  . Sexual activity: Not on file  Other Topics Concern  . Not on file  Social History Narrative  . Not on file   Social Determinants of Health   Financial Resource Strain: Not on file  Food Insecurity: Not on file  Transportation Needs: Not on file  Physical Activity: Not on file  Stress: Not on file  Social Connections: Not on file     Family History: The patient's family history is not on file.  ROS:   Please see the history of present illness.    All other systems reviewed and are negative.  EKGs/Labs/Other Studies Reviewed:  The following studies were reviewed today: Outside records   EKG:  The ekg ordered today demonstrates sinus rhythm  Recent Labs: No results found for requested labs within last 8760 hours.  Recent Lipid Panel    Component Value Date/Time   CHOL 190 02/25/2014 0403   TRIG 198 (H) 02/25/2014 0403   HDL 43 02/25/2014 0403   CHOLHDL 4.4 02/25/2014 0403   VLDL 40 02/25/2014 0403   LDLCALC 107 (H) 02/25/2014 0403    Physical Exam:    VS:  BP (!) 142/84   Pulse 69   Ht 5\' 5"  (1.651 m)   Wt 178 lb 9.6 oz (81 kg)   SpO2 96%   BMI 29.72 kg/m     Wt Readings from Last 3 Encounters:  11/20/20 178 lb 9.6 oz (81 kg)  10/21/19 150 lb (68 kg)   02/24/14 186 lb 3.2 oz (84.5 kg)     GEN:  Well nourished, well developed in no acute distress HEENT: Normal NECK: No JVD; No carotid bruits LYMPHATICS: No lymphadenopathy CARDIAC: RRR, no murmurs, rubs, gallops.  No murmur appreciated today. RESPIRATORY:  Clear to auscultation without rales, wheezing or rhonchi  ABDOMEN: Soft, non-tender, non-distended MUSCULOSKELETAL:  No edema; No deformity  SKIN: Warm and dry NEUROLOGIC:  Alert and oriented x 3 PSYCHIATRIC:  Normal affect   ASSESSMENT:    1. Murmur   2. Primary hypertension   3. Type 2 diabetes mellitus with other specified complication, with long-term current use of insulin (HCC)    PLAN:    In order of problems listed above:  I do not appreciate a murmur today.  Given the patient's history of uncontrolled hypertension, we will plan to order an echocardiogram to assess for any structural heart disease.  I have asked the patient increase his lisinopril to 40 mg by mouth once daily.  Given the patient's strong family history of coronary artery disease, I have recommended that he receive a coronary artery calcium score.  We will plan to touch base in 6 to 8 weeks after increasing his lisinopril, having the echo and CT scan to discuss the results and neck steps.   Medication Adjustments/Labs and Tests Ordered: Current medicines are reviewed at length with the patient today.  Concerns regarding medicines are outlined above.  Orders Placed This Encounter  Procedures  . CT CARDIAC SCORING (SELF PAY ONLY)  . ECHOCARDIOGRAM COMPLETE   Meds ordered this encounter  Medications  . lisinopril (ZESTRIL) 40 MG tablet    Sig: Take 1 tablet (40 mg total) by mouth daily.    Dispense:  90 tablet    Refill:  3     Signed, 02/26/14 T. Sheria Lang, MD, [facility], [facility] 11/20/2020 10:34 AM    Electrophysiology [facility]

## 2020-11-20 NOTE — Patient Instructions (Addendum)
Medication Instructions:  Your physician has recommended you make the following change in your medication:   1.  INCREASE your lisinopril--Take 40 mg by mouth daily  You may take 2 tablets of your 20 mg lisinopril daily until it is gone.  *If you need a refill on your cardiac medications before your next appointment, please call your pharmacy*  Lab Work: None ordered. If you have labs (blood work) drawn today and your tests are completely normal, you will receive your results only by: Marland Kitchen MyChart Message (if you have MyChart) OR . A paper copy in the mail If you have any lab test that is abnormal or we need to change your treatment, we will call you to review the results.  Testing/Procedures: Your physician has requested that you have an echocardiogram. Echocardiography is a painless test that uses sound waves to create images of your heart. It provides your doctor with information about the size and shape of your heart and how well your heart's chambers and valves are working. This procedure takes approximately one hour. There are no restrictions for this procedure.  Please schedule for ECHO  Please schedule for calcium scoring CT.  Follow-Up: At [facility], you and your health needs are our priority.  As part of our continuing mission to provide you with exceptional heart care, we have created designated Provider Care Teams.  These Care Teams include your primary Cardiologist (physician) and Advanced Practice Providers (APPs -  Physician Assistants and Nurse Practitioners) who all work together to provide you with the care you need, when you need it.  Your next appointment:   Your physician wants you to follow-up in: 6-8 weeks with Dr. Lalla Brothers.

## 2020-11-27 NOTE — Addendum Note (Signed)
Addended by: Solon Augusta on: 11/27/2020 07:56 AM   Modules accepted: Orders

## 2020-12-17 ENCOUNTER — Encounter (HOSPITAL_COMMUNITY): Payer: Self-pay | Admitting: Cardiology

## 2020-12-17 ENCOUNTER — Telehealth (HOSPITAL_COMMUNITY): Payer: Self-pay | Admitting: Cardiology

## 2020-12-17 ENCOUNTER — Other Ambulatory Visit (HOSPITAL_COMMUNITY): Payer: No Typology Code available for payment source

## 2020-12-17 ENCOUNTER — Inpatient Hospital Stay: Admission: RE | Admit: 2020-12-17 | Payer: Self-pay | Source: Ambulatory Visit

## 2020-12-17 NOTE — Telephone Encounter (Signed)
error 

## 2020-12-31 ENCOUNTER — Telehealth (HOSPITAL_COMMUNITY): Payer: Self-pay | Admitting: Cardiology

## 2020-12-31 NOTE — Telephone Encounter (Signed)
Just an FYI. We have made several attempts to contact this patient including sending a letter to schedule or reschedule their echocardiogram. We will be removing the patient from the echo WQ.    12/17/20 NO SHOWED-MAILED LETTER [facility]   Thank you 

## 2021-01-18 ENCOUNTER — Ambulatory Visit: Payer: No Typology Code available for payment source | Admitting: Cardiology

## 2021-01-18 NOTE — Progress Notes (Deleted)
Electrophysiology Office Follow up Visit Note:    Date:  01/18/2021   ID:  Brandon Walker, DOB 1960/10/20, MRN [id number]  PCP:  Mila Palmer, MD  [facility] Cardiologist:  None  [facility] Electrophysiologist:  Lanier Prude, MD    Interval History:    Brandon Walker is a 60 y.o. male who presents for a follow up visit.  I last saw the patient November 20, 2020.  That appointment was to evaluate a possible murmur.  On my physical exam I did not appreciate a murmur but did note that his blood pressure was above goal.  I increased his lisinopril to 40 mg by mouth once daily and ordered an echocardiogram and coronary artery calcium score.  His echocardiogram has not been completed at this time.  He is also not had his CT cardiac.     Past Medical History:  Diagnosis Date   Diabetes mellitus    Hypercholesteremia    Hypertension     Past Surgical History:  Procedure Laterality Date   CIRCUMCISION N/A 12/14/2016   Procedure: CIRCUMCISION ADULT, PENILE BLOCK;  Surgeon: Sebastian Ache, MD;  Location: [facility];  Service: Urology;  Laterality: N/A;   RECTOPERITONEAL FISTULA CLOSURE      Current Medications: No outpatient medications have been marked as taking for the 01/18/21 encounter (Appointment) with Lanier Prude, MD.     Allergies:   Patient has no known allergies.   Social History   Socioeconomic History   Marital status: Married    Spouse name: Not on file   Number of children: Not on file   Years of education: Not on file   Highest education level: Not on file  Occupational History   Not on file  Tobacco Use   Smoking status: Passive Smoke Exposure - Never Smoker   Smokeless tobacco: Current  Substance and Sexual Activity   Alcohol use: No   Drug use: No   Sexual activity: Not on file  Other Topics Concern   Not on file  Social History Narrative   Not on file   Social Determinants of Health   Financial Resource Strain: Not on file   Food Insecurity: Not on file  Transportation Needs: Not on file  Physical Activity: Not on file  Stress: Not on file  Social Connections: Not on file     Family History: The patient's family history is not on file.  ROS:   Please see the history of present illness.    All other systems reviewed and are negative.  EKGs/Labs/Other Studies Reviewed:    The following studies were reviewed today:   EKG:  The ekg ordered today demonstrates ***  Recent Labs: No results found for requested labs within last 8760 hours.  Recent Lipid Panel    Component Value Date/Time   CHOL 190 02/25/2014 0403   TRIG 198 (H) 02/25/2014 0403   HDL 43 02/25/2014 0403   CHOLHDL 4.4 02/25/2014 0403   VLDL 40 02/25/2014 0403   LDLCALC 107 (H) 02/25/2014 0403    Physical Exam:    VS:  There were no vitals taken for this visit.    Wt Readings from Last 3 Encounters:  11/20/20 178 lb 9.6 oz (81 kg)  10/21/19 150 lb (68 kg)  02/24/14 186 lb 3.2 oz (84.5 kg)     GEN: *** Well nourished, well developed in no acute distress HEENT: Normal NECK: No JVD; No carotid bruits LYMPHATICS: No lymphadenopathy CARDIAC: ***RRR, no murmurs, rubs, gallops  RESPIRATORY:  Clear to auscultation without rales, wheezing or rhonchi  ABDOMEN: Soft, non-tender, non-distended MUSCULOSKELETAL:  No edema; No deformity  SKIN: Warm and dry NEUROLOGIC:  Alert and oriented x 3 PSYCHIATRIC:  Normal affect   ASSESSMENT:    No diagnosis found. PLAN:    In order of problems listed above:    Total time spent with patient today *** minutes. This includes reviewing records, evaluating the patient and coordinating care.   Medication Adjustments/Labs and Tests Ordered: Current medicines are reviewed at length with the patient today.  Concerns regarding medicines are outlined above.  No orders of the defined types were placed in this encounter.  No orders of the defined types were placed in this  encounter.    Signed, Steffanie Dunn, MD, [facility], [facility] 01/18/2021 7:10 AM    Electrophysiology [facility]

## 2021-04-11 ENCOUNTER — Encounter: Payer: Self-pay | Admitting: Cardiology

## 2021-12-19 ENCOUNTER — Telehealth: Payer: Self-pay

## 2021-12-19 NOTE — Telephone Encounter (Signed)
NOTES SCANNED TO REFERRAL 

## 2022-01-31 ENCOUNTER — Encounter: Payer: Self-pay | Admitting: *Deleted

## 2022-09-05 DIAGNOSIS — U071 COVID-19: Secondary | ICD-10-CM | POA: Diagnosis not present

## 2022-09-05 DIAGNOSIS — J4520 Mild intermittent asthma, uncomplicated: Secondary | ICD-10-CM | POA: Diagnosis not present

## 2022-09-29 DIAGNOSIS — E1165 Type 2 diabetes mellitus with hyperglycemia: Secondary | ICD-10-CM | POA: Diagnosis not present

## 2022-11-12 DIAGNOSIS — E1165 Type 2 diabetes mellitus with hyperglycemia: Secondary | ICD-10-CM | POA: Diagnosis not present

## 2022-11-26 DIAGNOSIS — E113513 Type 2 diabetes mellitus with proliferative diabetic retinopathy with macular edema, bilateral: Secondary | ICD-10-CM | POA: Diagnosis not present

## 2022-12-11 DIAGNOSIS — E1165 Type 2 diabetes mellitus with hyperglycemia: Secondary | ICD-10-CM | POA: Diagnosis not present

## 2022-12-11 DIAGNOSIS — E559 Vitamin D deficiency, unspecified: Secondary | ICD-10-CM | POA: Diagnosis not present

## 2022-12-12 DIAGNOSIS — E1165 Type 2 diabetes mellitus with hyperglycemia: Secondary | ICD-10-CM | POA: Diagnosis not present

## 2022-12-25 ENCOUNTER — Telehealth: Payer: Self-pay | Admitting: Podiatry

## 2022-12-25 NOTE — Telephone Encounter (Signed)
Patient is new to our office and has appt scheduled with Dr. Ardelle Anton on June 6th.  He accidentally cut his toe when trimming the nail, which is what the appt is for.  However, he called today, stating it's hurting more now and he has blood all around the bottom of the nail.    He is wondering if he can come sooner.    Message routed to [facility] in scheduling and Dr. Ardelle Anton for FYI.  His best contact # [phone]  -Brandon Walker

## 2022-12-29 ENCOUNTER — Telehealth: Payer: Self-pay | Admitting: Podiatry

## 2022-12-29 NOTE — Telephone Encounter (Signed)
Left message for pt that we do not have a np appt sooner and that I did add pt to waitlist with any provider and we would call if we got any cancellations.

## 2022-12-29 NOTE — Telephone Encounter (Signed)
Patient called, stated he has appt (new patient) on the 6th with Dr. Ardelle Anton, but his left toe is really hurting him and asked if it could be bumped sooner.   Will route message to scheduling to see if there have been any cancellations.

## 2023-01-01 ENCOUNTER — Ambulatory Visit: Payer: BLUE CROSS/BLUE SHIELD | Admitting: Podiatry

## 2023-01-01 DIAGNOSIS — L601 Onycholysis: Secondary | ICD-10-CM

## 2023-01-01 DIAGNOSIS — L03032 Cellulitis of left toe: Secondary | ICD-10-CM

## 2023-01-01 DIAGNOSIS — L03039 Cellulitis of unspecified toe: Secondary | ICD-10-CM

## 2023-01-01 MED ORDER — CEPHALEXIN 500 MG PO CAPS: 500.0000 mg | ORAL_CAPSULE | Freq: Three times a day (TID) | ORAL | 0 refills | Status: AC

## 2023-01-01 NOTE — Patient Instructions (Signed)
Soak Instructions- MAKE SURE SOMEONE ELSE CHECKS THE TEMPERATURE OF THE WATER DUE TO THE NEUROPATHY    THE DAY AFTER THE PROCEDURE  Place 1/4 cup of epsom salts in a quart of warm tap water.  Submerge your foot or feet with outer bandage intact for the initial soak; this will allow the bandage to become moist and wet for easy lift off.  Once you remove your bandage, continue to soak in the solution for 20 minutes.  This soak should be done twice a day.  Next, remove your foot or feet from solution, blot dry the affected area and cover.  You may use a band aid large enough to cover the area or use gauze and tape.  Apply other medications to the area as directed by the doctor such as polysporin neosporin.  IF YOUR SKIN BECOMES IRRITATED WHILE USING THESE INSTRUCTIONS, IT IS OKAY TO SWITCH TO  WHITE VINEGAR AND WATER. Or you may use antibacterial soap and water to keep the toe clean  Monitor for any signs/symptoms of infection. Call the office immediately if any occur or go directly to the emergency room. Call with any questions/concerns.

## 2023-01-01 NOTE — Progress Notes (Signed)
Subjective:   Patient ID: Brandon Walker, male   DOB: 62 y.o.   MRN: [id number]   HPI Chief Complaint  Patient presents with   Nail Problem   62 year old male presents the office for above concerns.  He states that he has issues with his left big toenail now for over a month.  Is been dark in color and lifted and had some redness around the base of it.  No red streaks.  No fever or chills.   Review of Systems  All other systems reviewed and are negative.  Past Medical History:  Diagnosis Date   Diabetes mellitus    Hypercholesteremia    Hypertension     Past Surgical History:  Procedure Laterality Date   CIRCUMCISION N/A 12/14/2016   Procedure: CIRCUMCISION ADULT, PENILE BLOCK;  Surgeon: Sebastian Ache, MD;  Location: [facility];  Service: Urology;  Laterality: N/A;   RECTOPERITONEAL FISTULA CLOSURE       Current Outpatient Medications:    cephALEXin (KEFLEX) 500 MG capsule, Take 1 capsule (500 mg total) by mouth 3 (three) times daily., Disp: 21 capsule, Rfl: 0   albuterol (VENTOLIN HFA) 108 (90 Base) MCG/ACT inhaler, Inhale 1 puff into the lungs as needed for shortness of breath., Disp: , Rfl:    aspirin EC 81 MG tablet, Take 81 mg by mouth daily. Swallow whole., Disp: , Rfl:    DULoxetine (CYMBALTA) 30 MG capsule, Take 30 mg by mouth daily., Disp: , Rfl:    gabapentin (NEURONTIN) 300 MG capsule, Take 2 capsules by mouth 3 (three) times daily., Disp: , Rfl:    lisinopril (ZESTRIL) 40 MG tablet, Take 1 tablet (40 mg total) by mouth daily., Disp: 90 tablet, Rfl: 3   NAPROXEN PO, Take 1 tablet by mouth as needed (pain)., Disp: , Rfl:    predniSONE (DELTASONE) 10 MG tablet, Take 10 mg by mouth daily with breakfast., Disp: , Rfl:    Vitamin D, Ergocalciferol, (DRISDOL) 1.25 MG (50000 UNIT) CAPS capsule, Take 1 capsule by mouth once a week., Disp: , Rfl:   Allergies  Allergen Reactions   Glipizide     Other Reaction(s): Other (See Comments)  GI issues   Lovastatin     Other  Reaction(s): Other (See Comments)  Gi issues   Metformin And Related     Other Reaction(s): Other (See Comments)  gas          Objective:  Physical Exam  General: AAO x3, NAD  Dermatological: Left hallux nail is loose with underlying nailbed and almost completely with dried blood under the toenail plate is localized edema erythema on the base of the nail.  There is no ascending cellulitis.  No fluctuation or rotation.  No purulence.  Vascular: Dorsalis Pedis artery and Posterior Tibial artery pedal pulses are 2/4 bilateral with immedate capillary fill time.  There is no pain with calf compression, swelling, warmth, erythema.   Neruologic: Grossly intact via light touch bilateral.   Musculoskeletal: Discomfort left hallux nail.  No other areas of discomfort.  Gait: Unassisted, Nonantalgic.       Assessment:   Left hallux onycholysis with close infection     Plan:  -Treatment options discussed including all alternatives, risks, and complications -Etiology of symptoms were discussed -At this time, recommended total nail removal without chemical matricectomy to the left hallux due to infection. Risks and complications were discussed with the patient for which they understand and  verbally consent to the procedure. Under sterile conditions a total  of 3 mL of a mixture of 2% lidocaine plain and 0.5% Marcaine plain was infiltrated in a hallux block fashion. Once anesthetized, the skin was prepped in sterile fashion. A tourniquet was then applied. Next the left hallux nail was removed in total making to remove all nail borders.  Once the nail was removed, the area was debrided and the underlying skin was intact. The area was irrigated and hemostasis was obtained.  There was no purulence to culture.  A dry sterile dressing was applied. After application of the dressing the tourniquet was removed and there is found to be an immediate capillary refill time to the digit. The patient  tolerated the procedure well any complications. Post procedure instructions were discussed the patient for which he verbally understood. Follow-up in one week for nail check or sooner if any problems are to arise. Discussed signs/symptoms of worsening infection and directed to call the office immediately should any occur or go directly to the emergency room. In the meantime, encouraged to call the office with any questions, concerns, changes symptoms. -Keflex -Surgical shoe dispensed for offloading to facilitate healing    Vivi Barrack DPM

## 2023-01-13 DIAGNOSIS — E1165 Type 2 diabetes mellitus with hyperglycemia: Secondary | ICD-10-CM | POA: Diagnosis not present

## 2023-01-23 ENCOUNTER — Ambulatory Visit: Payer: BLUE CROSS/BLUE SHIELD | Admitting: Podiatry

## 2023-01-23 DIAGNOSIS — L03039 Cellulitis of unspecified toe: Secondary | ICD-10-CM | POA: Diagnosis not present

## 2023-01-23 MED ORDER — EFINACONAZOLE 10 % EX SOLN: 1.0000 [drp] | Freq: Every day | CUTANEOUS | 11 refills | Status: AC

## 2023-01-23 NOTE — Patient Instructions (Addendum)
For inserts I like POWERSTEPS, SUPERFEET, AETREX  --   Efinaconazole Topical Solution What is this medication? EFINACONAZOLE (e FEE na KON a zole) treats fungal infections of the nails. It belongs to a group of medications called antifungals. It will not treat infections caused by bacteria or viruses. This medicine may be used for other purposes; ask your health care provider or pharmacist if you have questions. COMMON BRAND NAME(S): JUBLIA What should I tell my care team before I take this medication? They need to know if you have any of these conditions: An unusual or allergic reaction to efinaconazole, other medications, foods, dyes or preservatives Pregnant or trying to get pregnant Breast-feeding How should I use this medication? This medication is for external use only. Do not take by mouth. Wash your hands before and after use. Do not get it in your eyes. If you do, rinse your eyes with plenty of cool tap water. Use it as directed on the prescription label. Do not use it more often than directed. Use the medication for the full course as directed by your care team, even if you think you are better. Do not stop using it unless your care team tells you to stop it early. This medication comes with INSTRUCTIONS FOR USE. Ask your pharmacist for directions on how to use this medication. Read the information carefully. Talk to your pharmacist or care team if you have questions. Talk to your care team about the use of this medication in children. While it may be prescribed for children as young as 6 years for selected conditions, precautions do apply. Overdosage: If you think you have taken too much of this medicine contact a poison control center or emergency room at once. NOTE: This medicine is only for you. Do not share this medicine with others. What if I miss a dose? If you miss a dose, use it as soon as you can. If it is almost time for your next dose, use only that dose. Do not use double  or extra doses. What may interact with this medication? Interactions are not expected. Do not use any other skin products on the same area of skin without talking to your care team. This list may not describe all possible interactions. Give your health care provider a list of all the medicines, herbs, non-prescription drugs, or dietary supplements you use. Also tell them if you smoke, drink alcohol, or use illegal drugs. Some items may interact with your medicine. What should I watch for while using this medication? Visit your care team for regular checks on your progress. It may be some time before you see the benefit from this medication. Do not use nail polish or other nail cosmetic products on the treated nails. What side effects may I notice from receiving this medication? Side effects that you should report to your care team as soon as possible: Allergic reactions--skin rash, itching, hives, swelling of the face, lips, tongue, or throat Side effects that usually do not require medical attention (report to your care team if they continue or are bothersome): Ingrown nails Mild skin irritation, redness, or dryness This list may not describe all possible side effects. Call your doctor for medical advice about side effects. You may report side effects to FDA at 1-800-FDA-1088. Where should I keep my medication? Keep out of the reach of children and pets. Store at room temperature between 20 and 25 degrees C (68 and 77 degrees F). Do not freeze. Keep the container tightly  closed. Get rid of any unused medication after the expiration date. This medication is flammable. Avoid exposure to heat, flame, and smoking. To get rid of medications that are no longer needed or have expired: Take the medication to a medication take-back program. Check with your pharmacy or law enforcement to find a location. If you cannot return the medication, ask your pharmacist or care team how to get rid of this  medication safely. NOTE: This sheet is a summary. It may not cover all possible information. If you have questions about this medicine, talk to your doctor, pharmacist, or health care provider.  2024 Elsevier/Gold Standard (2021-09-23 00:00:00)

## 2023-01-28 DIAGNOSIS — E1165 Type 2 diabetes mellitus with hyperglycemia: Secondary | ICD-10-CM | POA: Diagnosis not present

## 2023-02-02 NOTE — Progress Notes (Signed)
Subjective: Chief Complaint  Patient presents with   Nail Problem    Nail check  No pain    62 year old male presents the office for above concerns.  States he is doing well.  No pain.  No swelling redness or drainage.  No new concerns.  Objective: AAO x3, NAD DP/PT pulses palpable bilaterally, CRT less than 3 seconds Status post nail avulsion.  Peers to have healed.  Small amount of scabbing still present but there is no open lesion.  There is no drainage or pus.  No erythema.  No obvious signs of infection. No pain with calf compression, swelling, warmth, erythema  Assessment: Status post nail avulsion, healing well  Plan: -All treatment options discussed with the patient including all alternatives, risks, complications.  -Wound appears to be healing.  No signs of infection at this time.  Will monitor the nail as it grows back out should come in for, discolored or any issues to let me know. -Patient encouraged to call the office with any questions, concerns, change in symptoms.   Vivi Barrack DPM

## 2023-03-06 DIAGNOSIS — E1165 Type 2 diabetes mellitus with hyperglycemia: Secondary | ICD-10-CM | POA: Diagnosis not present

## 2023-03-13 DIAGNOSIS — I10 Essential (primary) hypertension: Secondary | ICD-10-CM | POA: Diagnosis not present

## 2023-03-13 DIAGNOSIS — E1140 Type 2 diabetes mellitus with diabetic neuropathy, unspecified: Secondary | ICD-10-CM | POA: Diagnosis not present

## 2023-03-13 DIAGNOSIS — E1165 Type 2 diabetes mellitus with hyperglycemia: Secondary | ICD-10-CM | POA: Diagnosis not present

## 2023-03-13 DIAGNOSIS — R809 Proteinuria, unspecified: Secondary | ICD-10-CM | POA: Diagnosis not present

## 2023-04-08 DIAGNOSIS — E1165 Type 2 diabetes mellitus with hyperglycemia: Secondary | ICD-10-CM | POA: Diagnosis not present

## 2023-05-28 DIAGNOSIS — E1165 Type 2 diabetes mellitus with hyperglycemia: Secondary | ICD-10-CM | POA: Diagnosis not present

## 2023-06-05 DIAGNOSIS — E113592 Type 2 diabetes mellitus with proliferative diabetic retinopathy without macular edema, left eye: Secondary | ICD-10-CM | POA: Diagnosis not present

## 2023-06-16 DIAGNOSIS — Z01818 Encounter for other preprocedural examination: Secondary | ICD-10-CM | POA: Diagnosis not present

## 2023-06-16 DIAGNOSIS — H25811 Combined forms of age-related cataract, right eye: Secondary | ICD-10-CM | POA: Diagnosis not present

## 2023-06-17 DIAGNOSIS — E559 Vitamin D deficiency, unspecified: Secondary | ICD-10-CM | POA: Diagnosis not present

## 2023-06-17 DIAGNOSIS — E538 Deficiency of other specified B group vitamins: Secondary | ICD-10-CM | POA: Diagnosis not present

## 2023-06-17 DIAGNOSIS — E1140 Type 2 diabetes mellitus with diabetic neuropathy, unspecified: Secondary | ICD-10-CM | POA: Diagnosis not present

## 2023-06-17 DIAGNOSIS — I10 Essential (primary) hypertension: Secondary | ICD-10-CM | POA: Diagnosis not present

## 2023-06-17 DIAGNOSIS — F411 Generalized anxiety disorder: Secondary | ICD-10-CM | POA: Diagnosis not present

## 2023-06-17 DIAGNOSIS — E1165 Type 2 diabetes mellitus with hyperglycemia: Secondary | ICD-10-CM | POA: Diagnosis not present

## 2023-06-30 DIAGNOSIS — E1165 Type 2 diabetes mellitus with hyperglycemia: Secondary | ICD-10-CM | POA: Diagnosis not present

## 2023-07-01 DIAGNOSIS — G4733 Obstructive sleep apnea (adult) (pediatric): Secondary | ICD-10-CM | POA: Diagnosis not present

## 2023-07-01 DIAGNOSIS — E1136 Type 2 diabetes mellitus with diabetic cataract: Secondary | ICD-10-CM | POA: Diagnosis not present

## 2023-07-01 DIAGNOSIS — H25811 Combined forms of age-related cataract, right eye: Secondary | ICD-10-CM | POA: Diagnosis not present

## 2023-07-07 DIAGNOSIS — G8929 Other chronic pain: Secondary | ICD-10-CM | POA: Diagnosis not present

## 2023-07-07 DIAGNOSIS — Z23 Encounter for immunization: Secondary | ICD-10-CM | POA: Diagnosis not present

## 2023-07-07 DIAGNOSIS — I10 Essential (primary) hypertension: Secondary | ICD-10-CM | POA: Diagnosis not present

## 2023-07-08 ENCOUNTER — Ambulatory Visit: Payer: No Typology Code available for payment source | Admitting: Dietician

## 2023-07-15 DIAGNOSIS — G4733 Obstructive sleep apnea (adult) (pediatric): Secondary | ICD-10-CM | POA: Diagnosis not present

## 2023-07-15 DIAGNOSIS — E1165 Type 2 diabetes mellitus with hyperglycemia: Secondary | ICD-10-CM | POA: Diagnosis not present

## 2023-07-15 DIAGNOSIS — E1136 Type 2 diabetes mellitus with diabetic cataract: Secondary | ICD-10-CM | POA: Diagnosis not present

## 2023-07-15 DIAGNOSIS — H2512 Age-related nuclear cataract, left eye: Secondary | ICD-10-CM | POA: Diagnosis not present

## 2023-07-15 DIAGNOSIS — I10 Essential (primary) hypertension: Secondary | ICD-10-CM | POA: Diagnosis not present

## 2023-07-15 DIAGNOSIS — H25812 Combined forms of age-related cataract, left eye: Secondary | ICD-10-CM | POA: Diagnosis not present

## 2023-08-07 DIAGNOSIS — E1165 Type 2 diabetes mellitus with hyperglycemia: Secondary | ICD-10-CM | POA: Diagnosis not present

## 2023-08-26 DIAGNOSIS — J069 Acute upper respiratory infection, unspecified: Secondary | ICD-10-CM | POA: Diagnosis not present

## 2023-09-07 DIAGNOSIS — E1165 Type 2 diabetes mellitus with hyperglycemia: Secondary | ICD-10-CM | POA: Diagnosis not present

## 2023-09-29 DIAGNOSIS — F411 Generalized anxiety disorder: Secondary | ICD-10-CM | POA: Diagnosis not present

## 2023-09-29 DIAGNOSIS — I10 Essential (primary) hypertension: Secondary | ICD-10-CM | POA: Diagnosis not present

## 2023-09-29 DIAGNOSIS — E1140 Type 2 diabetes mellitus with diabetic neuropathy, unspecified: Secondary | ICD-10-CM | POA: Diagnosis not present

## 2023-09-29 DIAGNOSIS — E1165 Type 2 diabetes mellitus with hyperglycemia: Secondary | ICD-10-CM | POA: Diagnosis not present

## 2023-09-29 DIAGNOSIS — R809 Proteinuria, unspecified: Secondary | ICD-10-CM | POA: Diagnosis not present

## 2023-11-02 DIAGNOSIS — E1165 Type 2 diabetes mellitus with hyperglycemia: Secondary | ICD-10-CM | POA: Diagnosis not present

## 2023-12-14 DIAGNOSIS — E1165 Type 2 diabetes mellitus with hyperglycemia: Secondary | ICD-10-CM | POA: Diagnosis not present

## 2024-02-08 ENCOUNTER — Encounter: Payer: Self-pay | Attending: Internal Medicine | Admitting: Dietician

## 2024-02-08 ENCOUNTER — Encounter: Payer: Self-pay | Admitting: Dietician

## 2024-02-08 DIAGNOSIS — E119 Type 2 diabetes mellitus without complications: Secondary | ICD-10-CM | POA: Insufficient documentation

## 2024-02-08 NOTE — Progress Notes (Signed)
 Diabetes Self-Management Education  Visit Type: First/Initial  Appt. Start Time: 1140 Appt. End Time: 1250  02/08/2024  [name], identified by name and date of birth, is a 63 y.o. male with a diagnosis of Diabetes: Type 2.   ASSESSMENT Pt wife, Brandon Walker, present for appointment Pt reports using Dexcom G6 and Tandem T-Slim insulin  pump, reports difficulty getting A1c below 10%. Pt wife reports glucose will not come down after meals most times, PPBG usually 200-250 mg/dL, pt states they will change their canual site and this fixes the problem most of the time. Pt reports taking Ozempic @0 .5 weekly now, states they are dealing with frequent constipation, reports suppressed appetite. Pt reports difficulty counting carbs, usually boluses 6-8 units per meal Pt reports drinking very little water, may drink 8-12 oz per day. Pt reports severe constipation on a regular basis and occasional stomach burning, states they take magnesium for constipation with little relief. Pt experienced significant GI upset, cold sweats, and dizziness during visit, water and 6 oz. Apple juice given, CBG finger pick was 186 mg/dL, pt was able to have a bowl movement and began to feel better.   Diabetes Self-Management Education - 02/08/24 1153       Visit Information   Visit Type First/Initial      Initial Visit   Diabetes Type Type 2    Date Diagnosed 22 years ago    Are you currently following a meal plan? No    Are you taking your medications as prescribed? Yes      Health Coping   How would you rate your overall health? Fair      Psychosocial Assessment   Patient Belief/Attitude about Diabetes Motivated to manage diabetes    What is the hardest part about your diabetes right now, causing you the most concern, or is the most worrisome to you about your diabetes?   Taking/obtaining medications    Self-care barriers None    Self-management support Doctor's office;Family    Other persons present  Patient;Spouse/SO    Patient Concerns Nutrition/Meal planning;Glycemic Control    Special Needs None    Preferred Learning Style No preference indicated    Learning Readiness Ready    What is the last grade level you completed in school? 11th grade      Pre-Education Assessment   Patient understands the diabetes disease and treatment process. Needs Instruction    Patient understands incorporating nutritional management into lifestyle. Needs Instruction    Patient undertands incorporating physical activity into lifestyle. Needs Instruction    Patient understands using medications safely. Needs Instruction    Patient understands monitoring blood glucose, interpreting and using results Needs Instruction    Patient understands prevention, detection, and treatment of acute complications. Needs Instruction    Patient understands prevention, detection, and treatment of chronic complications. Needs Instruction    Patient understands how to develop strategies to address psychosocial issues. Needs Instruction    Patient understands how to develop strategies to promote health/change behavior. Needs Instruction      Complications   Last HgB A1C per patient/outside source 10.6 %   12/30/2022   How often do you check your blood sugar? > 4 times/day   CGM   Fasting Blood glucose range (mg/dL) >799;819-799    Postprandial Blood glucose range (mg/dL) >799    Number of hypoglycemic episodes per month 1    Can you tell when your blood sugar is low? Yes    What do you do if  your blood sugar is low? Eat sugar    Number of hyperglycemic episodes ( >200mg /dL): Daily    Can you tell when your blood sugar is high? No    Are you checking your feet? Yes    How many days per week are you checking your feet? 7   Nueropathy     Activity / Exercise   Activity / Exercise Type ADL's      Patient Education   Previous Diabetes Education Yes    Disease Pathophysiology Explored patient's options for treatment of their  diabetes    Healthy Eating Carbohydrate counting;Role of diet in the treatment of diabetes and the relationship between the three main macronutrients and blood glucose level    Being Active Identified with patient nutritional and/or medication changes necessary with exercise.    Medications Taught/reviewed insulin /injectables, injection, site rotation, insulin /injectables storage and needle disposal.    Monitoring Taught/evaluated CGM (comment);Identified appropriate SMBG and/or A1C goals.    Acute complications Discussed and identified patients' prevention, symptoms, and treatment of hyperglycemia.;Taught prevention, symptoms, and  treatment of hypoglycemia - the 15 rule.    Chronic complications Relationship between chronic complications and blood glucose control;Assessed and discussed foot care and prevention of foot problems;Lipid levels, blood glucose control and heart disease    Diabetes Stress and Support Identified and addressed patients feelings and concerns about diabetes;Helped patient identify a support system for diabetes management      Individualized Goals (developed by patient)   Nutrition Adjust meds/carbs with exercise as discussed;Carb counting    Medications take my medication as prescribed    Monitoring  Consistenly use CGM    Problem Solving Eating Pattern    Reducing Risk examine blood glucose patterns;treat hypoglycemia with 15 grams of carbs if blood glucose less than 70mg /dL;do foot checks daily;check ketones if blood glucose over 240mg /dL    Health Coping Discuss barriers to diabetes care with support person/system (comment specifics as needed)      Post-Education Assessment   Patient understands the diabetes disease and treatment process. Comprehends key points    Patient understands incorporating nutritional management into lifestyle. Comprehends key points    Patient undertands incorporating physical activity into lifestyle. Comprehends key points    Patient  understands using medications safely. Needs Review    Patient understands monitoring blood glucose, interpreting and using results Needs Review    Patient understands prevention, detection, and treatment of acute complications. Comprehends key points    Patient understands prevention, detection, and treatment of chronic complications. Needs Review    Patient understands how to develop strategies to address psychosocial issues. Comprehends key points    Patient understands how to develop strategies to promote health/change behavior. Needs Review      Outcomes   Expected Outcomes Demonstrated interest in learning but significant barriers to change    Future DMSE PRN    Program Status Not Completed          Individualized Plan for Diabetes Self-Management Training:   Learning Objective:  Patient will have a greater understanding of diabetes self-management. Patient education plan is to attend individual and/or group sessions per assessed needs and concerns.   Plan:   Patient Instructions  Work towards eating three meals a day, about 5-6 hours apart!  Begin to recognize carbohydrates, proteins, and non-starchy vegetables in your food choices!  Have 3-4 carb choices at each meal (45-60g).   Begin to build your meals using the proportions of the Balanced Plate. First, select your carb  choice(s) for the meal, and determine how much you should have to equal 3-4 carb choices (45-60g).Make this 25% of your meal. Next, select your source of protein to pair with your carb choice(s). Make this another 25% of your meal. Finally, complete your meal with a variety of non-starchy vegetables. Make this the remaining 50% of your meal.   Expected Outcomes:  Demonstrated interest in learning but significant barriers to change  Education material provided: Carbohydrate counting sheet, Protein foods list, Gastroparesis Nutrition Therapy  If problems or questions, patient to contact team via:  Phone  and Email  Future DSME appointment: PRN

## 2024-02-08 NOTE — Patient Instructions (Signed)
 Work towards eating three meals a day, about 5-6 hours apart!  Begin to recognize carbohydrates, proteins, and non-starchy vegetables in your food choices!  Have 3-4 carb choices at each meal (45-60g).   Begin to build your meals using the proportions of the Balanced Plate. First, select your carb choice(s) for the meal, and determine how much you should have to equal 3-4 carb choices (45-60g).Make this 25% of your meal. Next, select your source of protein to pair with your carb choice(s). Make this another 25% of your meal. Finally, complete your meal with a variety of non-starchy vegetables. Make this the remaining 50% of your meal.
